# Patient Record
Sex: Female | Born: 1950 | Race: Black or African American | Hispanic: No | Smoking: Never smoker
Health system: Southern US, Community
[De-identification: ages and names within clinical notes are randomized; demographics above are authoritative.]

## PROBLEM LIST (undated history)

## (undated) DIAGNOSIS — M1990 Unspecified osteoarthritis, unspecified site: Secondary | ICD-10-CM

## (undated) DIAGNOSIS — E785 Hyperlipidemia, unspecified: Secondary | ICD-10-CM

## (undated) DIAGNOSIS — I10 Essential (primary) hypertension: Secondary | ICD-10-CM

## (undated) DIAGNOSIS — Z87898 Personal history of other specified conditions: Secondary | ICD-10-CM

## (undated) DIAGNOSIS — M25519 Pain in unspecified shoulder: Secondary | ICD-10-CM

## (undated) DIAGNOSIS — I739 Peripheral vascular disease, unspecified: Secondary | ICD-10-CM

## (undated) DIAGNOSIS — Z96642 Presence of left artificial hip joint: Secondary | ICD-10-CM

## (undated) HISTORY — DX: Essential (primary) hypertension: I10

## (undated) HISTORY — DX: Peripheral vascular disease, unspecified: I73.9

## (undated) HISTORY — DX: Pain in unspecified shoulder: M25.519

## (undated) HISTORY — DX: Personal history of other specified conditions: Z87.898

## (undated) HISTORY — DX: Hyperlipidemia, unspecified: E78.5

## (undated) HISTORY — DX: Presence of left artificial hip joint: Z96.642

---

## 2016-03-23 DIAGNOSIS — I10 Essential (primary) hypertension: Secondary | ICD-10-CM

## 2016-03-23 HISTORY — DX: Essential (primary) hypertension: I10

## 2016-05-03 ENCOUNTER — Other Ambulatory Visit: Payer: Self-pay | Admitting: Family Medicine

## 2016-05-03 DIAGNOSIS — Z1231 Encounter for screening mammogram for malignant neoplasm of breast: Secondary | ICD-10-CM

## 2016-06-07 ENCOUNTER — Ambulatory Visit: Payer: Self-pay

## 2017-03-09 ENCOUNTER — Ambulatory Visit (INDEPENDENT_AMBULATORY_CARE_PROVIDER_SITE_OTHER): Payer: Non-veteran care | Admitting: Orthopaedic Surgery

## 2017-03-09 ENCOUNTER — Ambulatory Visit (INDEPENDENT_AMBULATORY_CARE_PROVIDER_SITE_OTHER): Payer: Non-veteran care

## 2017-03-09 ENCOUNTER — Encounter (INDEPENDENT_AMBULATORY_CARE_PROVIDER_SITE_OTHER): Payer: Self-pay | Admitting: Orthopaedic Surgery

## 2017-03-09 DIAGNOSIS — M1612 Unilateral primary osteoarthritis, left hip: Secondary | ICD-10-CM

## 2017-03-09 NOTE — Progress Notes (Signed)
Office Visit Note   Patient: Terri Rasmussen           Date of Birth: 04/22/1950           MRN: [id number] Visit Date: 03/09/2017              Requested by: Prentiss BellsLizardo-Morales, Diana, MD [address] [place], [place] [address] PCP: Asencion GowdaMitchell, L.August Saucerean, MD   Assessment & Plan: Visit Diagnoses:  1. Primary osteoarthritis of left hip     Plan: Impression is degenerative joint disease of the left hip.  X-rays demonstrate destruction of joint space with multiple subchondral cysts and osteophyte formation.  These were discussed with the patient as well as treatment options including nonoperative treatment.  She wishes to proceed with a total hip replacement after a full discussion of risk benefits alternatives.  She will let us know when she would like to schedule this.  Follow-Up Instructions: Return if symptoms worsen or fail to improve.   Orders:  Orders Placed This Encounter  Procedures  . XR HIP UNILAT W OR W/O PELVIS 2-3 VIEWS LEFT   No orders of the defined types were placed in this encounter.     Procedures: No procedures performed   Clinical Data: No additional findings.   Subjective: Chief Complaint  Patient presents with  . Left Hip - Pain    Ms. Terri Rasmussen is a very pleasant 66 year old female who comes in with left hip pain that radiates down the anterior thigh and into the groin.  She takes ibuprofen which gives partial relief.  She has significant difficulty with ADLs and chronic pain with ambulation.  This is causing her to limp.  She denies any numbness and tingling    Review of Systems  Constitutional: Negative.   HENT: Negative.   Eyes: Negative.   Respiratory: Negative.   Cardiovascular: Negative.   Endocrine: Negative.   Musculoskeletal: Negative.   Neurological: Negative.   Hematological: Negative.   Psychiatric/Behavioral: Negative.   All other systems reviewed and are negative.    Objective: Vital Signs: There were no vitals taken for this  visit.  Physical Exam  Constitutional: She is oriented to person, place, and time. She appears well-developed and well-nourished.  HENT:  Head: Normocephalic and atraumatic.  Eyes: EOM are normal.  Neck: Neck supple.  Pulmonary/Chest: Effort normal.  Abdominal: Soft.  Neurological: She is alert and oriented to person, place, and time.  Skin: Skin is warm. Capillary refill takes less than 2 seconds.  Psychiatric: She has a normal mood and affect. Her behavior is normal. Judgment and thought content normal.  Nursing note and vitals reviewed.   Ortho Exam Left hip exam shows painful internal and external rotation.  Positive Stinchfield sign.  Lateral hip is nontender.  Negative straight leg impression is advanced degenerative joint disease left hip Specialty Comments:  No specialty comments available.  Imaging: Xr Hip Unilat W Or W/o Pelvis 2-3 Views Left  Result Date: 03/09/2017 Advanced left hip DJD    PMFS History: There are no active problems to display for this patient.  History reviewed. No pertinent past medical history.  History reviewed. No pertinent family history.  History reviewed. No pertinent surgical history. Social History   Occupational History  . Not on file  Tobacco Use  . Smoking status: Never Smoker  . Smokeless tobacco: Never Used  Substance and Sexual Activity  . Alcohol use: Not on file  . Drug use: Not on file  . Sexual activity: Not on file

## 2017-03-16 ENCOUNTER — Ambulatory Visit (INDEPENDENT_AMBULATORY_CARE_PROVIDER_SITE_OTHER): Payer: Non-veteran care | Admitting: Orthopaedic Surgery

## 2017-03-16 ENCOUNTER — Encounter (INDEPENDENT_AMBULATORY_CARE_PROVIDER_SITE_OTHER): Payer: Self-pay | Admitting: Orthopaedic Surgery

## 2017-03-16 DIAGNOSIS — M1612 Unilateral primary osteoarthritis, left hip: Secondary | ICD-10-CM | POA: Diagnosis not present

## 2017-03-16 NOTE — Progress Notes (Signed)
   Office Visit Note   Patient: Terri Rasmussen           Date of Birth: 09/26/1950           MRN: [id number] Visit Date: 03/16/2017              Requested by: Clovis RileyMitchell, L.August Saucerean, MD [address] [place], [place] [address] PCP: Clovis RileyMitchell, L.August Saucerean, MD   Assessment & Plan: Visit Diagnoses:  1. Primary osteoarthritis of left hip     Plan: Impression is advanced degenerative joint disease left hip.  Patient has no life and has significant difficulty with ADLs and constant night pain.  She wishes to pursue left total hip replacement.  We discussed the risk benefits alternatives to surgery and she understands and wishes to proceed.  We will schedule this in the near future per her convenience.  Follow-Up Instructions: Return if symptoms worsen or fail to improve.   Orders:  No orders of the defined types were placed in this encounter.  No orders of the defined types were placed in this encounter.     Procedures: No procedures performed   Clinical Data: No additional findings.   Subjective: Chief Complaint  Patient presents with  . Left Hip - Pain, Follow-up    Patient comes in today to discuss total hip replacement surgery and to have her questions answered.  She is ready to have the surgery.  She presents here today with her son.    Review of Systems   Objective: Vital Signs: There were no vitals taken for this visit.  Physical Exam  Ortho Exam Left hip exam is stable. Specialty Comments:  No specialty comments available.  Imaging: No results found.   PMFS History: There are no active problems to display for this patient.  No past medical history on file.  No family history on file.  No past surgical history on file. Social History   Occupational History  . Not on file  Tobacco Use  . Smoking status: Never Smoker  . Smokeless tobacco: Never Used  Substance and Sexual Activity  . Alcohol use: Not on file  . Drug use: Not on file  .  Sexual activity: Not on file

## 2017-04-25 NOTE — Pre-Procedure Instructions (Signed)
Terri Rasmussen  04/25/2017     No Pharmacies Listed   Your procedure is scheduled on  Wednesday 05/03/17  [facility] at 530 A.M.  Call this number if you have problems the morning of surgery:  [phone]   Remember:  Do not eat food or drink liquids after midnight.  Take these medicines the morning of surgery with A SIP OF WATER -  NONE  7 days prior to surgery STOP taking any Aspirin(unless otherwise instructed by your surgeon), Aleve, Naproxen, Ibuprofen, Motrin, Advil, Goody's, BC's, all herbal medications, fish oil, and all vitamins   Do not wear jewelry, make-up or nail polish.  Do not wear lotions, powders, or perfumes, or deodorant.  Do not shave 48 hours prior to surgery.  Men may shave face and neck.  Do not bring valuables to the hospital.  [facility] is not responsible for any belongings or valuables.  Contacts, dentures or bridgework may not be worn into surgery.  Leave your suitcase in the car.  After surgery it may be brought to your room.  For patients admitted to the hospital, discharge time will be determined by your treatment team.  Patients discharged the day of surgery will not be allowed to drive home.   Name and phone number of your driver:    Special instructions:  [facility] - Preparing for Surgery  Before surgery, you can play an important role.  Because skin is not sterile, your skin needs to be as free of germs as possible.  You can reduce the number of germs on you skin by washing with CHG (chlorahexidine gluconate) soap before surgery.  CHG is an antiseptic cleaner which kills germs and bonds with the skin to continue killing germs even after washing.  Please DO NOT use if you have an allergy to CHG or antibacterial soaps.  If your skin becomes reddened/irritated stop using the CHG and inform your nurse when you arrive at Short Stay.  Do not shave (including legs and underarms) for at least 48 hours prior to the  first CHG shower.  You may shave your face.  Please follow these instructions carefully:   1.  Shower with CHG Soap the night before surgery and the                                morning of Surgery.  2.  If you choose to wash your hair, wash your hair first as usual with your       normal shampoo.  3.  After you shampoo, rinse your hair and body thoroughly to remove the                      Shampoo.  4.  Use CHG as you would any other liquid soap.  You can apply chg directly       to the skin and wash gently with scrungie or a clean washcloth.  5.  Apply the CHG Soap to your body ONLY FROM THE NECK DOWN.        Do not use on open wounds or open sores.  Avoid contact with your eyes,       ears, mouth and genitals (private parts).  Wash genitals (private parts)       with your normal soap.  6.  Wash thoroughly, paying special attention to the area where your surgery  will be performed.  7.  Thoroughly rinse your body with warm water from the neck down.  8.  DO NOT shower/wash with your normal soap after using and rinsing off       the CHG Soap.  9.  Pat yourself dry with a clean towel.            10.  Wear clean pajamas.            11.  Place clean sheets on your bed the night of your first shower and do not        sleep with pets.  Day of Surgery  Do not apply any lotions/deoderants the morning of surgery.  Please wear clean clothes to the hospital/surgery center.    Please read over the following fact sheets that you were given. Total Joint Packet and MRSA Information

## 2017-04-26 ENCOUNTER — Encounter (HOSPITAL_COMMUNITY)
Admission: RE | Admit: 2017-04-26 | Discharge: 2017-04-26 | Disposition: A | Discharge status: Home / Self Care | Payer: Non-veteran care | Source: Ambulatory Visit | Attending: Orthopaedic Surgery | Admitting: Orthopaedic Surgery

## 2017-04-26 ENCOUNTER — Encounter (HOSPITAL_COMMUNITY)
Admission: RE | Admit: 2017-04-26 | Discharge: 2017-04-26 | Disposition: A | Discharge status: Home / Self Care | Payer: Non-veteran care | Source: Ambulatory Visit | Attending: Physician Assistant | Admitting: Physician Assistant

## 2017-04-26 ENCOUNTER — Encounter (HOSPITAL_COMMUNITY): Payer: Self-pay | Admitting: *Deleted

## 2017-04-26 ENCOUNTER — Other Ambulatory Visit: Payer: Self-pay

## 2017-04-26 DIAGNOSIS — M1612 Unilateral primary osteoarthritis, left hip: Secondary | ICD-10-CM | POA: Diagnosis not present

## 2017-04-26 DIAGNOSIS — Z01818 Encounter for other preprocedural examination: Secondary | ICD-10-CM | POA: Diagnosis present

## 2017-04-26 HISTORY — DX: Unspecified osteoarthritis, unspecified site: M19.90

## 2017-04-26 LAB — CBC WITH DIFFERENTIAL/PLATELET
BASOS ABS: 0 10*3/uL (ref 0.0–0.1)
BASOS PCT: 0 %
Eosinophils Absolute: 0.1 10*3/uL (ref 0.0–0.7)
Eosinophils Relative: 3 %
HEMATOCRIT: 39.5 % (ref 36.0–46.0)
Hemoglobin: 13 g/dL (ref 12.0–15.0)
LYMPHS PCT: 35 %
Lymphs Abs: 1.8 10*3/uL (ref 0.7–4.0)
MCH: 31.4 pg (ref 26.0–34.0)
MCHC: 32.9 g/dL (ref 30.0–36.0)
MCV: 95.4 fL (ref 78.0–100.0)
Monocytes Absolute: 0.4 10*3/uL (ref 0.1–1.0)
Monocytes Relative: 8 %
NEUTROS ABS: 2.8 10*3/uL (ref 1.7–7.7)
Neutrophils Relative %: 54 %
Platelets: 209 10*3/uL (ref 150–400)
RBC: 4.14 MIL/uL (ref 3.87–5.11)
RDW: 12.6 % (ref 11.5–15.5)
WBC: 5.1 10*3/uL (ref 4.0–10.5)

## 2017-04-26 LAB — COMPREHENSIVE METABOLIC PANEL
ALBUMIN: 4.1 g/dL (ref 3.5–5.0)
ALT: 27 U/L (ref 14–54)
AST: 25 U/L (ref 15–41)
Alkaline Phosphatase: 67 U/L (ref 38–126)
Anion gap: 10 (ref 5–15)
BILIRUBIN TOTAL: 0.8 mg/dL (ref 0.3–1.2)
BUN: 8 mg/dL (ref 6–20)
CHLORIDE: 107 mmol/L (ref 101–111)
CO2: 25 mmol/L (ref 22–32)
CREATININE: 0.94 mg/dL (ref 0.44–1.00)
Calcium: 9.1 mg/dL (ref 8.9–10.3)
GFR calc Af Amer: >60 mL/min (ref >60)
GLUCOSE: 99 mg/dL (ref 65–99)
Potassium: 3.5 mmol/L (ref 3.5–5.1)
Sodium: 142 mmol/L (ref 135–145)
TOTAL PROTEIN: 7.6 g/dL (ref 6.5–8.1)

## 2017-04-26 LAB — SURGICAL PCR SCREEN
MRSA, PCR: POSITIVE — AB
Staphylococcus aureus: POSITIVE — AB

## 2017-04-26 LAB — APTT: APTT: 29 s (ref 24–36)

## 2017-04-26 LAB — TYPE AND SCREEN
ABO/RH(D): B POS
ANTIBODY SCREEN: NEGATIVE

## 2017-04-26 LAB — ABO/RH: ABO/RH(D): B POS

## 2017-04-26 LAB — PROTIME-INR
INR: 0.98
PROTHROMBIN TIME: 12.9 s (ref 11.4–15.2)

## 2017-04-26 NOTE — Progress Notes (Signed)
Could you call this patient and let them know nasal swab positive for mrsa and that we will call in mupirocin nasal ointment?  Will you also send in the mupirocin ointment?  Apply to both nostrils tid for 5 days prior to sx

## 2017-04-26 NOTE — Progress Notes (Signed)
Let's add 1 g of vanc in addition to ancef please.  Thanks.

## 2017-04-27 ENCOUNTER — Other Ambulatory Visit (INDEPENDENT_AMBULATORY_CARE_PROVIDER_SITE_OTHER): Payer: Self-pay

## 2017-04-27 ENCOUNTER — Other Ambulatory Visit (INDEPENDENT_AMBULATORY_CARE_PROVIDER_SITE_OTHER): Payer: Self-pay | Admitting: Physician Assistant

## 2017-04-27 NOTE — Progress Notes (Signed)
IC patient and she already has the ointment, they called it in for her from hospital and she got it yesterday

## 2017-04-27 NOTE — Progress Notes (Signed)
done

## 2017-05-02 MED ORDER — TRANEXAMIC ACID 1000 MG/10ML IV SOLN
1000.0000 mg | INTRAVENOUS | Status: AC
  Administered 2017-05-03: 1000 mg via INTRAVENOUS
  Filled 2017-05-02: qty 1100

## 2017-05-02 MED ORDER — VANCOMYCIN HCL IN DEXTROSE 1-5 GM/200ML-% IV SOLN
1000.0000 mg | INTRAVENOUS | Status: DC
  Filled 2017-05-02: qty 200

## 2017-05-02 NOTE — Anesthesia Preprocedure Evaluation (Addendum)
Anesthesia Evaluation  Patient identified by MRN, date of birth, ID band Patient awake    Reviewed: Allergy & Precautions, NPO status , Patient's Chart, lab work & pertinent test results  Airway Mallampati: II  TM Distance: >3 FB Neck ROM: Full    Dental  (+) Dental Advisory Given, Teeth Intact   Pulmonary neg pulmonary ROS,    Pulmonary exam normal breath sounds clear to auscultation       Cardiovascular negative cardio ROS Normal cardiovascular exam Rhythm:Regular Rate:Normal     Neuro/Psych negative neurological ROS  negative psych ROS   GI/Hepatic negative GI ROS, (+)     substance abuse  marijuana use,   Endo/Other  negative endocrine ROS  Renal/GU negative Renal ROS  negative genitourinary   Musculoskeletal  (+) Arthritis ,   Abdominal   Peds  Hematology negative hematology ROS (+)   Anesthesia Other Findings   Reproductive/Obstetrics                            Anesthesia Physical Anesthesia Plan  ASA: II  Anesthesia Plan: Spinal   Post-op Pain Management:    Induction:   PONV Risk Score and Plan: Treatment may vary due to age or medical condition  Airway Management Planned: Natural Airway and Nasal Cannula  Additional Equipment: None  Intra-op Plan:   Post-operative Plan:   Informed Consent: I have reviewed the patients History and Physical, chart, labs and discussed the procedure including the risks, benefits and alternatives for the proposed anesthesia with the patient or authorized representative who has indicated his/her understanding and acceptance.   Dental advisory given  Plan Discussed with: CRNA  Anesthesia Plan Comments:         Anesthesia Quick Evaluation

## 2017-05-03 ENCOUNTER — Encounter (HOSPITAL_COMMUNITY)
Admission: RE | Disposition: A | Discharge status: Home Health | Payer: Self-pay | Source: Ambulatory Visit | Attending: Orthopaedic Surgery

## 2017-05-03 ENCOUNTER — Inpatient Hospital Stay (HOSPITAL_COMMUNITY): Payer: Non-veteran care | Admitting: Anesthesiology

## 2017-05-03 ENCOUNTER — Inpatient Hospital Stay (HOSPITAL_COMMUNITY): Payer: Non-veteran care

## 2017-05-03 ENCOUNTER — Other Ambulatory Visit: Payer: Self-pay

## 2017-05-03 ENCOUNTER — Inpatient Hospital Stay (HOSPITAL_COMMUNITY)
Admission: RE | Admit: 2017-05-03 | Discharge: 2017-05-04 | DRG: 470 | Disposition: A | Discharge status: Home Health | Payer: Non-veteran care | Source: Ambulatory Visit | Attending: Orthopaedic Surgery | Admitting: Orthopaedic Surgery

## 2017-05-03 ENCOUNTER — Encounter (HOSPITAL_COMMUNITY): Payer: Self-pay

## 2017-05-03 DIAGNOSIS — M1612 Unilateral primary osteoarthritis, left hip: Principal | ICD-10-CM | POA: Diagnosis present

## 2017-05-03 DIAGNOSIS — Z96642 Presence of left artificial hip joint: Secondary | ICD-10-CM

## 2017-05-03 DIAGNOSIS — Z96649 Presence of unspecified artificial hip joint: Secondary | ICD-10-CM

## 2017-05-03 DIAGNOSIS — Z419 Encounter for procedure for purposes other than remedying health state, unspecified: Secondary | ICD-10-CM

## 2017-05-03 DIAGNOSIS — Z888 Allergy status to other drugs, medicaments and biological substances status: Secondary | ICD-10-CM | POA: Diagnosis not present

## 2017-05-03 HISTORY — PX: TOTAL HIP ARTHROPLASTY: SHX124

## 2017-05-03 HISTORY — DX: Presence of left artificial hip joint: Z96.642

## 2017-05-03 SURGERY — ARTHROPLASTY, HIP, TOTAL, ANTERIOR APPROACH
Anesthesia: Spinal | Site: Hip | Laterality: Left

## 2017-05-03 MED ORDER — PROPOFOL 10 MG/ML IV BOLUS
INTRAVENOUS | Status: DC | PRN
  Administered 2017-05-03: 30 mg via INTRAVENOUS

## 2017-05-03 MED ORDER — DIPHENHYDRAMINE HCL 12.5 MG/5ML PO ELIX: 25.0000 mg | ORAL_SOLUTION | ORAL | Status: DC | PRN

## 2017-05-03 MED ORDER — SODIUM CHLORIDE 0.9 % IV SOLN
INTRAVENOUS | Status: DC
  Administered 2017-05-03: 15:00:00 via INTRAVENOUS

## 2017-05-03 MED ORDER — SORBITOL 70 % SOLN: 30.0000 mL | Freq: Every day | Status: DC | PRN

## 2017-05-03 MED ORDER — PROMETHAZINE HCL 25 MG PO TABS: 25.0000 mg | ORAL_TABLET | Freq: Four times a day (QID) | ORAL | 1 refills | Status: DC | PRN

## 2017-05-03 MED ORDER — ALUM & MAG HYDROXIDE-SIMETH 200-200-20 MG/5ML PO SUSP: 30.0000 mL | ORAL | Status: DC | PRN

## 2017-05-03 MED ORDER — MAGNESIUM CITRATE PO SOLN: 1.0000 | Freq: Once | ORAL | Status: DC | PRN

## 2017-05-03 MED ORDER — PROPOFOL 500 MG/50ML IV EMUL
INTRAVENOUS | Status: DC | PRN
  Administered 2017-05-03: 100 ug/kg/min via INTRAVENOUS

## 2017-05-03 MED ORDER — ASPIRIN EC 325 MG PO TBEC
325.0000 mg | DELAYED_RELEASE_TABLET | Freq: Two times a day (BID) | ORAL | Status: DC
  Administered 2017-05-03 – 2017-05-04 (×2): 325 mg via ORAL
  Filled 2017-05-03 (×2): qty 1

## 2017-05-03 MED ORDER — ACETAMINOPHEN 500 MG PO TABS
1000.0000 mg | ORAL_TABLET | Freq: Four times a day (QID) | ORAL | Status: AC
  Administered 2017-05-03 – 2017-05-04 (×3): 1000 mg via ORAL
  Filled 2017-05-03 (×3): qty 2

## 2017-05-03 MED ORDER — ACETAMINOPHEN 325 MG PO TABS: 650.0000 mg | ORAL_TABLET | ORAL | Status: DC | PRN

## 2017-05-03 MED ORDER — CHLORHEXIDINE GLUCONATE 4 % EX LIQD: 60.0000 mL | Freq: Once | CUTANEOUS | Status: DC

## 2017-05-03 MED ORDER — CEFAZOLIN SODIUM-DEXTROSE 2-4 GM/100ML-% IV SOLN
2.0000 g | Freq: Four times a day (QID) | INTRAVENOUS | Status: AC
  Administered 2017-05-03 – 2017-05-04 (×3): 2 g via INTRAVENOUS
  Filled 2017-05-03 (×3): qty 100

## 2017-05-03 MED ORDER — DEXAMETHASONE SODIUM PHOSPHATE 10 MG/ML IJ SOLN
10.0000 mg | Freq: Once | INTRAMUSCULAR | Status: AC
  Administered 2017-05-04: 10 mg via INTRAVENOUS
  Filled 2017-05-03: qty 1

## 2017-05-03 MED ORDER — PROMETHAZINE HCL 25 MG/ML IJ SOLN: 6.2500 mg | INTRAMUSCULAR | Status: DC | PRN

## 2017-05-03 MED ORDER — BUPIVACAINE IN DEXTROSE 0.75-8.25 % IT SOLN
INTRATHECAL | Status: DC | PRN
  Administered 2017-05-03: 1.8 mL via INTRATHECAL

## 2017-05-03 MED ORDER — MORPHINE SULFATE (PF) 2 MG/ML IV SOLN: 1.0000 mg | INTRAVENOUS | Status: DC | PRN

## 2017-05-03 MED ORDER — OXYCODONE HCL 5 MG PO TABS: 5.0000 mg | ORAL_TABLET | Freq: Once | ORAL | Status: DC | PRN

## 2017-05-03 MED ORDER — VANCOMYCIN HCL IN DEXTROSE 1-5 GM/200ML-% IV SOLN
1000.0000 mg | INTRAVENOUS | Status: AC
  Administered 2017-05-03: 1000 mg via INTRAVENOUS
  Filled 2017-05-03: qty 200

## 2017-05-03 MED ORDER — LIDOCAINE HCL (CARDIAC) 20 MG/ML IV SOLN
INTRAVENOUS | Status: DC | PRN
  Administered 2017-05-03: 100 mg via INTRAVENOUS

## 2017-05-03 MED ORDER — MIDAZOLAM HCL 2 MG/2ML IJ SOLN
INTRAMUSCULAR | Status: DC | PRN
  Administered 2017-05-03: 2 mg via INTRAVENOUS

## 2017-05-03 MED ORDER — FENTANYL CITRATE (PF) 100 MCG/2ML IJ SOLN
INTRAMUSCULAR | Status: DC | PRN
  Administered 2017-05-03: 50 ug via INTRAVENOUS

## 2017-05-03 MED ORDER — VANCOMYCIN HCL 1000 MG IV SOLR
INTRAVENOUS | Status: AC
  Filled 2017-05-03: qty 1000

## 2017-05-03 MED ORDER — OXYCODONE HCL ER 10 MG PO T12A: 10.0000 mg | EXTENDED_RELEASE_TABLET | Freq: Two times a day (BID) | ORAL | 0 refills | Status: DC

## 2017-05-03 MED ORDER — MIDAZOLAM HCL 2 MG/2ML IJ SOLN
INTRAMUSCULAR | Status: AC
  Filled 2017-05-03: qty 2

## 2017-05-03 MED ORDER — VANCOMYCIN HCL 1000 MG IV SOLR
INTRAVENOUS | Status: DC | PRN
  Administered 2017-05-03: 1000 mg via TOPICAL

## 2017-05-03 MED ORDER — METOCLOPRAMIDE HCL 5 MG PO TABS: 5.0000 mg | ORAL_TABLET | Freq: Three times a day (TID) | ORAL | Status: DC | PRN

## 2017-05-03 MED ORDER — 0.9 % SODIUM CHLORIDE (POUR BTL) OPTIME
TOPICAL | Status: DC | PRN
  Administered 2017-05-03: 1000 mL

## 2017-05-03 MED ORDER — OXYCODONE HCL 5 MG PO TABS: 10.0000 mg | ORAL_TABLET | ORAL | Status: DC | PRN

## 2017-05-03 MED ORDER — TRANEXAMIC ACID 1000 MG/10ML IV SOLN
1000.0000 mg | Freq: Once | INTRAVENOUS | Status: AC
  Administered 2017-05-03: 1000 mg via INTRAVENOUS
  Filled 2017-05-03: qty 10

## 2017-05-03 MED ORDER — ONDANSETRON HCL 4 MG/2ML IJ SOLN: 4.0000 mg | Freq: Four times a day (QID) | INTRAMUSCULAR | Status: DC | PRN

## 2017-05-03 MED ORDER — MENTHOL 3 MG MT LOZG: 1.0000 | LOZENGE | OROMUCOSAL | Status: DC | PRN

## 2017-05-03 MED ORDER — TIZANIDINE HCL 4 MG PO TABS: 4.0000 mg | ORAL_TABLET | Freq: Four times a day (QID) | ORAL | 2 refills | Status: DC | PRN

## 2017-05-03 MED ORDER — TRANEXAMIC ACID 1000 MG/10ML IV SOLN
2000.0000 mg | Freq: Once | INTRAVENOUS | Status: DC
  Filled 2017-05-03 (×2): qty 20

## 2017-05-03 MED ORDER — FENTANYL CITRATE (PF) 100 MCG/2ML IJ SOLN: 25.0000 ug | INTRAMUSCULAR | Status: DC | PRN

## 2017-05-03 MED ORDER — METOCLOPRAMIDE HCL 5 MG/ML IJ SOLN: 5.0000 mg | Freq: Three times a day (TID) | INTRAMUSCULAR | Status: DC | PRN

## 2017-05-03 MED ORDER — PROPOFOL 10 MG/ML IV BOLUS
INTRAVENOUS | Status: AC
  Filled 2017-05-03: qty 20

## 2017-05-03 MED ORDER — OXYCODONE HCL 5 MG PO TABS: 5.0000 mg | ORAL_TABLET | ORAL | 0 refills | Status: DC | PRN

## 2017-05-03 MED ORDER — OXYCODONE HCL ER 10 MG PO T12A
10.0000 mg | EXTENDED_RELEASE_TABLET | Freq: Two times a day (BID) | ORAL | Status: DC
  Administered 2017-05-03 – 2017-05-04 (×3): 10 mg via ORAL
  Filled 2017-05-03 (×3): qty 1

## 2017-05-03 MED ORDER — PHENYLEPHRINE HCL 10 MG/ML IJ SOLN
INTRAMUSCULAR | Status: DC | PRN
  Administered 2017-05-03: 80 ug via INTRAVENOUS

## 2017-05-03 MED ORDER — SENNOSIDES-DOCUSATE SODIUM 8.6-50 MG PO TABS: 1.0000 | ORAL_TABLET | Freq: Every evening | ORAL | 1 refills | Status: DC | PRN

## 2017-05-03 MED ORDER — LACTATED RINGERS IV SOLN
INTRAVENOUS | Status: DC
  Administered 2017-05-03 (×2): via INTRAVENOUS

## 2017-05-03 MED ORDER — ONDANSETRON HCL 4 MG PO TABS: 4.0000 mg | ORAL_TABLET | Freq: Three times a day (TID) | ORAL | 0 refills | Status: DC | PRN

## 2017-05-03 MED ORDER — POLYETHYLENE GLYCOL 3350 17 G PO PACK: 17.0000 g | PACK | Freq: Every day | ORAL | Status: DC | PRN

## 2017-05-03 MED ORDER — FENTANYL CITRATE (PF) 250 MCG/5ML IJ SOLN
INTRAMUSCULAR | Status: AC
  Filled 2017-05-03: qty 5

## 2017-05-03 MED ORDER — CEFAZOLIN SODIUM-DEXTROSE 2-4 GM/100ML-% IV SOLN
2.0000 g | INTRAVENOUS | Status: AC
  Administered 2017-05-03: 2 g via INTRAVENOUS
  Filled 2017-05-03: qty 100

## 2017-05-03 MED ORDER — OXYCODONE HCL 5 MG PO TABS: 5.0000 mg | ORAL_TABLET | ORAL | Status: DC | PRN

## 2017-05-03 MED ORDER — PHENOL 1.4 % MT LIQD: 1.0000 | OROMUCOSAL | Status: DC | PRN

## 2017-05-03 MED ORDER — OXYCODONE HCL 5 MG/5ML PO SOLN: 5.0000 mg | Freq: Once | ORAL | Status: DC | PRN

## 2017-05-03 MED ORDER — TRANEXAMIC ACID 1000 MG/10ML IV SOLN
INTRAVENOUS | Status: AC | PRN
  Administered 2017-05-03: 2000 mg via TOPICAL

## 2017-05-03 MED ORDER — METHOCARBAMOL 1000 MG/10ML IJ SOLN
500.0000 mg | Freq: Four times a day (QID) | INTRAVENOUS | Status: DC | PRN
  Filled 2017-05-03: qty 5

## 2017-05-03 MED ORDER — SODIUM CHLORIDE 0.9 % IR SOLN
Status: DC | PRN
  Administered 2017-05-03: 3000 mL

## 2017-05-03 MED ORDER — KETOROLAC TROMETHAMINE 15 MG/ML IJ SOLN
15.0000 mg | Freq: Four times a day (QID) | INTRAMUSCULAR | Status: AC
  Administered 2017-05-03 – 2017-05-04 (×3): 15 mg via INTRAVENOUS
  Filled 2017-05-03 (×3): qty 1

## 2017-05-03 MED ORDER — ACETAMINOPHEN 650 MG RE SUPP: 650.0000 mg | RECTAL | Status: DC | PRN

## 2017-05-03 MED ORDER — ASPIRIN EC 325 MG PO TBEC: 325.0000 mg | DELAYED_RELEASE_TABLET | Freq: Two times a day (BID) | ORAL | 0 refills | Status: DC

## 2017-05-03 MED ORDER — ONDANSETRON HCL 4 MG PO TABS: 4.0000 mg | ORAL_TABLET | Freq: Four times a day (QID) | ORAL | Status: DC | PRN

## 2017-05-03 MED ORDER — ONDANSETRON HCL 4 MG/2ML IJ SOLN
INTRAMUSCULAR | Status: DC | PRN
  Administered 2017-05-03: 4 mg via INTRAVENOUS

## 2017-05-03 MED ORDER — METHOCARBAMOL 500 MG PO TABS: 500.0000 mg | ORAL_TABLET | Freq: Four times a day (QID) | ORAL | Status: DC | PRN

## 2017-05-03 SURGICAL SUPPLY — 43 items
BAG DECANTER FOR FLEXI CONT (MISCELLANEOUS) ×3 IMPLANT
CAPT HIP TOTAL 2 ×3 IMPLANT
CELLS DAT CNTRL 66122 CELL SVR (MISCELLANEOUS) ×1 IMPLANT
CLOSURE STERI-STRIP 1/2X4 (GAUZE/BANDAGES/DRESSINGS) ×1
CLSR STERI-STRIP ANTIMIC 1/2X4 (GAUZE/BANDAGES/DRESSINGS) ×2 IMPLANT
COVER SURGICAL LIGHT HANDLE (MISCELLANEOUS) ×3 IMPLANT
DRAPE C-ARM 42X72 X-RAY (DRAPES) ×3 IMPLANT
DRAPE STERI IOBAN 125X83 (DRAPES) ×3 IMPLANT
DRAPE U-SHAPE 47X51 STRL (DRAPES) ×6 IMPLANT
DRSG AQUACEL AG ADV 3.5X10 (GAUZE/BANDAGES/DRESSINGS) ×3 IMPLANT
DURAPREP 26ML APPLICATOR (WOUND CARE) ×3 IMPLANT
ELECT BLADE 4.0 EZ CLEAN MEGAD (MISCELLANEOUS) ×3
ELECT REM PT RETURN 9FT ADLT (ELECTROSURGICAL) ×3
ELECTRODE BLDE 4.0 EZ CLN MEGD (MISCELLANEOUS) ×1 IMPLANT
ELECTRODE REM PT RTRN 9FT ADLT (ELECTROSURGICAL) ×1 IMPLANT
GLOVE SKINSENSE NS SZ7.5 (GLOVE) ×2
GLOVE SKINSENSE STRL SZ7.5 (GLOVE) ×1 IMPLANT
GLOVE SURG SYN 7.5  E (GLOVE) ×4
GLOVE SURG SYN 7.5 E (GLOVE) ×2 IMPLANT
GOWN SRG XL XLNG 56XLVL 4 (GOWN DISPOSABLE) ×1 IMPLANT
GOWN STRL NON-REIN XL XLG LVL4 (GOWN DISPOSABLE) ×2
GOWN STRL REUS W/ TWL LRG LVL3 (GOWN DISPOSABLE) IMPLANT
GOWN STRL REUS W/TWL LRG LVL3 (GOWN DISPOSABLE)
HANDPIECE INTERPULSE COAX TIP (DISPOSABLE) ×2
HOOD PEEL AWAY FLYTE STAYCOOL (MISCELLANEOUS) ×6 IMPLANT
IV NS IRRIG 3000ML ARTHROMATIC (IV SOLUTION) ×3 IMPLANT
KIT BASIN OR (CUSTOM PROCEDURE TRAY) ×3 IMPLANT
MARKER SKIN DUAL TIP RULER LAB (MISCELLANEOUS) ×3 IMPLANT
PACK TOTAL JOINT (CUSTOM PROCEDURE TRAY) ×3 IMPLANT
PACK UNIVERSAL I (CUSTOM PROCEDURE TRAY) ×3 IMPLANT
RTRCTR WOUND ALEXIS 18CM MED (MISCELLANEOUS) ×3
SAW OSC TIP CART 19.5X105X1.3 (SAW) ×3 IMPLANT
SET HNDPC FAN SPRY TIP SCT (DISPOSABLE) ×1 IMPLANT
STAPLER VISISTAT 35W (STAPLE) IMPLANT
SUT ETHIBOND 2 V 37 (SUTURE) ×3 IMPLANT
SUT MNCRL AB 3-0 PS2 18 (SUTURE) ×3 IMPLANT
SUT VIC AB 1 CT1 27 (SUTURE) ×2
SUT VIC AB 1 CT1 27XBRD ANBCTR (SUTURE) ×1 IMPLANT
SUT VIC AB 2-0 CT1 27 (SUTURE) ×4
SUT VIC AB 2-0 CT1 TAPERPNT 27 (SUTURE) ×2 IMPLANT
TOWEL OR 17X26 10 PK STRL BLUE (TOWEL DISPOSABLE) ×3 IMPLANT
TRAY CATH 16FR W/PLASTIC CATH (SET/KITS/TRAYS/PACK) IMPLANT
YANKAUER SUCT BULB TIP NO VENT (SUCTIONS) ×6 IMPLANT

## 2017-05-03 NOTE — Discharge Instructions (Signed)

## 2017-05-03 NOTE — Transfer of Care (Signed)
Immediate Anesthesia Transfer of Care Note  Patient: Romilda Proby  Procedure(s) Performed: LEFT TOTAL HIP ARTHROPLASTY ANTERIOR APPROACH (Left Hip)  Patient Location: PACU  Anesthesia Type:MAC and Spinal  Level of Consciousness: awake, alert  and oriented  Airway & Oxygen Therapy: Patient Spontanous Breathing and Patient connected to face mask oxygen  Post-op Assessment: Report given to RN and Post -op Vital signs reviewed and stable  Post vital signs: Reviewed and stable  Last Vitals:  Vitals:   05/03/17 0545  BP: (!) 133/94  Pulse: 63  Resp: 20  Temp: 36.4 C  SpO2: 100%    Last Pain:  Vitals:   05/03/17 0545  TempSrc: Oral  PainSc: 2       Patients Stated Pain Goal: 3 (50/56/97 9480)  Complications: No apparent anesthesia complications

## 2017-05-03 NOTE — Anesthesia Postprocedure Evaluation (Signed)
Anesthesia Post Note  Patient: Terri Rasmussen  Procedure(s) Performed: LEFT TOTAL HIP ARTHROPLASTY ANTERIOR APPROACH (Left Hip)     Patient location during evaluation: PACU Anesthesia Type: Spinal Level of consciousness: awake and alert Pain management: pain level controlled Vital Signs Assessment: post-procedure vital signs reviewed and stable Respiratory status: spontaneous breathing and respiratory function stable Cardiovascular status: blood pressure returned to baseline and stable Postop Assessment: spinal receding and no apparent nausea or vomiting Anesthetic complications: no    Last Vitals:  Vitals:   05/03/17 1055 05/03/17 1110  BP: (!) 141/85   Pulse: (!) 50 (!) 52  Resp: 13 13  Temp:    SpO2: 100% 100%    Last Pain:  Vitals:   05/03/17 0940  TempSrc:   PainSc: 0-No pain    LLE Motor Response: Purposeful movement;Responds to commands (05/03/17 1110) LLE Sensation: Decreased(spinal block) (05/03/17 1110) RLE Motor Response: Purposeful movement;Responds to commands (05/03/17 1110) RLE Sensation: Decreased(spinal block) (05/03/17 1110) L Sensory Level: L5-Outer lower leg, top of foot, great toe (05/03/17 1110) R Sensory Level: L5-Outer lower leg, top of foot, great toe (05/03/17 1110)  Beryle Lathehomas E Rudell Marlowe

## 2017-05-03 NOTE — Progress Notes (Signed)
Orthopedic Tech Progress Note Patient Details:  Terri CarwinVanessa Rasmussen 07/01/1950 [id number] Applied overhead frame to bed.     Post Interventions Patient Tolerated: Well Instructions Provided: Care of device   Jennye MoccasinHughes, Draya Felker Craig 05/03/2017, 7:21 PM

## 2017-05-03 NOTE — Plan of Care (Signed)
  Nutrition: Adequate nutrition will be maintained 05/03/2017 1614 - Progressing by Darrow BussingArcilla, Shawndrea Rutkowski M, RN   Elimination: Will not experience complications related to bowel motility 05/03/2017 1614 - Progressing by Darrow BussingArcilla, Karna Abed M, RN   Pain Managment: General experience of comfort will improve 05/03/2017 1614 - Progressing by Darrow BussingArcilla, Alexandro Line M, RN   Safety: Ability to remain free from injury will improve 05/03/2017 1614 - Progressing by Darrow BussingArcilla, Domonik Levario M, RN

## 2017-05-03 NOTE — Anesthesia Procedure Notes (Signed)
Spinal  Patient location during procedure: OR Start time: 05/03/2017 7:45 AM End time: 05/03/2017 7:48 AM Staffing Anesthesiologist: Beryle LatheBrock, Marjorie Lussier E, MD Performed: anesthesiologist  Preanesthetic Checklist Completed: patient identified, surgical consent, pre-op evaluation, timeout performed, IV checked, risks and benefits discussed and monitors and equipment checked Spinal Block Patient position: sitting Prep: DuraPrep Patient monitoring: heart rate, cardiac monitor, continuous pulse ox and blood pressure Approach: midline Location: L3-4 Injection technique: single-shot Needle Needle type: Pencan  Needle gauge: 24 G Additional Notes Functioning IV was confirmed and monitors were applied. Sterile prep and drape, including hand hygiene, mask, and sterile gloves were used. The patient was positioned and the spine was prepped. The skin was anesthetized with lidocaine. Free flow of clear CSF was obtained prior to injecting local anesthetic into the CSF. The spinal needle aspirated freely following injection. The needle was carefully withdrawn. The patient tolerated the procedure well. Consent was obtained prior to the procedure with all questions answered and concerns addressed. Risks including, but not limited to, bleeding, infection, nerve damage, paralysis, failed block, inadequate analgesia, allergic reaction, high spinal, itching, and headache were discussed and the patient wished to proceed.  Terri Peerhomas [name], MD

## 2017-05-03 NOTE — Op Note (Signed)
LEFT TOTAL HIP ARTHROPLASTY ANTERIOR APPROACH  Procedure Note Terri Rasmussen   [id number]  Pre-op Diagnosis: left hip degenerative joint disease     Post-op Diagnosis: same   Operative Procedures  1. Total hip replacement; Left hip; uncemented [id number]   Personnel  Surgeon(s): Tarry KosXu, Octavius Shin M, MD  Assist: Oneal GroutMary Lindsey Stanbery, PA-C; necessary for the timely completion of procedure and due to complexity of procedure.   Anesthesia: spinal  Prosthesis: Depuy Acetabulum: Pinnacle 52 mm Femur: Corail KA 14 Head: 36 mm size: +1.5 Liner: +4 neutral Bearing Type: ceramic on poly  Total Hip Arthroplasty (Anterior Approach) Op Note:  After informed consent was obtained and the operative extremity marked in the holding area, the patient was brought back to the operating room and placed supine on the HANA table. Next, the operative extremity was prepped and draped in normal sterile fashion. Surgical timeout occurred verifying patient identification, surgical site, surgical procedure and administration of antibiotics.  A modified anterior Smith-Peterson approach to the hip was performed, using the interval between tensor fascia lata and sartorius.  Dissection was carried bluntly down onto the anterior hip capsule. The lateral femoral circumflex vessels were identified and coagulated. A capsulotomy was performed and the capsular flaps tagged for later repair.  Fluoroscopy was utilized to prepare for the femoral neck cut. The neck osteotomy was performed. The femoral head was removed, the acetabular rim was cleared of soft tissue and attention was turned to reaming the acetabulum.  Sequential reaming was performed under fluoroscopic guidance. We reamed to a size 51 mm, and then impacted the acetabular shell. The liner was then placed after irrigation and attention turned to the femur.  After placing the femoral hook, the leg was taken to externally rotated, extended and adducted position taking care  to perform soft tissue releases to allow for adequate mobilization of the femur. Soft tissue was cleared from the shoulder of the greater trochanter and the hook elevator used to improve exposure of the proximal femur. Sequential broaching performed up to a size 14. Trial neck and head were placed. The leg was brought back up to neutral and the construct reduced. The position and sizing of components, offset and leg lengths were checked using fluoroscopy. Stability of the  construct was checked in extension and external rotation without any subluxation or impingement of prosthesis. We dislocated the prosthesis, dropped the leg back into position, removed trial components, and irrigated copiously. The final stem and head was then placed, the leg brought back up, the system reduced and fluoroscopy used to verify positioning.  We irrigated, obtained hemostasis and closed the capsule using #2 ethibond suture.  One gram of vancomycin powder was placed in the surgical bed. The fascia was closed with #1 vicryl plus, the deep fat layer was closed with 0 vicryl, the subcutaneous layers closed with 2.0 Vicryl Plus and the skin closed with 3.0 monocryl and steri strips. A sterile dressing was applied. The patient was awakened in the operating room and taken to recovery in stable condition.  All sponge, needle, and instrument counts were correct at the end of the case.   Position: supine  Complications: none.  Time Out: performed   Drains/Packing: none  Estimated blood loss: 150 cc  Returned to Recovery Room: in good condition.   Antibiotics: yes   Mechanical VTE (DVT) Prophylaxis: sequential compression devices, TED thigh-high  Chemical VTE (DVT) Prophylaxis: aspirin   Fluid Replacement: see anesthesia record  Specimens Removed: 1 to pathology  Sponge and Instrument Count Correct? yes   PACU: portable radiograph - low AP   Admission: inpatient status  Plan/RTC: Return in 2 weeks for staple  removal. Weight Bearing/Load Lower Extremity: full  Hip precautions: none Suture Removal: 10-14 days  Betadine to incision twice daily once dressing is removed on POD#7  N. Glee Arvin, MD [facility] [phone] 9:13 AM     Implant Name Type Inv. Item Serial No. Manufacturer Lot No. LRB No. Used  LINER NEUTRAL [id number] PLUS 4 - [id number] Liner LINER NEUTRAL 52X36MM PLUS 4  DEPUY SYNTHES [id number] Left 1  PIN SECTOR W/GRIP ACE CUP - [id number] Hips PIN SECTOR W/GRIP ACE CUP  DEPUY SYNTHES [id number] Left 1  SCREW 6.5MMX25MM - [id number] Screw SCREW 6.5MMX25MM  DEPUY SYNTHES [id number] Left 1  STEM CORAIL KA14 - [id number] Stem STEM CORAIL KA14  DEPUY SYNTHES [id number] Left 1  HEAD CERAMIC DELTA 36 PLUS 1.5 - [id number] Hips HEAD CERAMIC DELTA 36 PLUS 1.5  DEPUY SYNTHES [id number] Left 1

## 2017-05-03 NOTE — H&P (Signed)
    PREOPERATIVE H&P  Chief Complaint: left hip degenerative joint disease  HPI: Terri Rasmussen is a 67 y.o. female who presents for surgical treatment of left hip degenerative joint disease.  She denies any changes in medical history.  Past Medical History:  Diagnosis Date  . Arthritis    Past Surgical History:  Procedure Laterality Date  . NO PAST SURGERIES     Social History   Socioeconomic History  . Marital status: Legally Separated    Spouse name: None  . Number of children: None  . Years of education: None  . Highest education level: None  Social Needs  . Financial resource strain: None  . Food insecurity - worry: None  . Food insecurity - inability: None  . Transportation needs - medical: None  . Transportation needs - non-medical: None  Occupational History  . None  Tobacco Use  . Smoking status: Never Smoker  . Smokeless tobacco: Never Used  Substance and Sexual Activity  . Alcohol use: Yes    Comment: beer  . Drug use: Yes    Types: Marijuana  . Sexual activity: None  Other Topics Concern  . None  Social History Narrative  . None   History reviewed. No pertinent family history. No Known Allergies Prior to Admission medications   Medication Sig Start Date End Date Taking? Authorizing Provider  ibuprofen (ADVIL,MOTRIN) 600 MG tablet Take 600 mg by mouth every 6 (six) hours as needed for headache or moderate pain. 03/23/16  Yes [provider]     Positive ROS: All other systems have been reviewed and were otherwise negative with the exception of those mentioned in the HPI and as above.  Physical Exam: General: Alert, no acute distress Cardiovascular: No pedal edema Respiratory: No cyanosis, no use of accessory musculature GI: abdomen soft Skin: No lesions in the area of chief complaint Neurologic: Sensation intact distally Psychiatric: Patient is competent for consent with normal mood and affect Lymphatic: no  lymphedema  MUSCULOSKELETAL: exam stable  Assessment: left hip degenerative joint disease  Plan: Plan for Procedure(s): LEFT TOTAL HIP ARTHROPLASTY ANTERIOR APPROACH  The risks benefits and alternatives were discussed with the patient including but not limited to the risks of nonoperative treatment, versus surgical intervention including infection, bleeding, nerve injury,  blood clots, cardiopulmonary complications, morbidity, mortality, among others, and they were willing to proceed.   Glee ArvinMichael Xu, MD   05/03/2017 7:29 AM

## 2017-05-03 NOTE — Evaluation (Signed)
Physical Therapy Evaluation Patient Details Name: Terri CarwinVanessa Fitchett MRN: [id number] DOB: 02/03/1951 Today's Date: 05/03/2017   History of Present Illness  Pt is a 67 y/o female s/p elective L THA, direct anterior approach. Pt with no pertinent PMH.  Clinical Impression  Pt is s/p surgery above with deficits below. Pt tolerated ambulation very well this session and required min guard assist throughout. Reports she will be staying with her daughter at d/c, so she will have assist at home. Will continue to follow acutely to maximize functional mobility independence and safety.     Follow Up Recommendations DC plan and follow up therapy as arranged by surgeon;Supervision for mobility/OOB    Equipment Recommendations  Rolling walker with 5" wheels;3in1 (PT)    Recommendations for Other Services       Precautions / Restrictions Precautions Precautions: None Precaution Comments: Reviewed supine ther ex with pt.  Restrictions Weight Bearing Restrictions: Yes LLE Weight Bearing: Weight bearing as tolerated      Mobility  Bed Mobility Overal bed mobility: Needs Assistance Bed Mobility: Supine to Sit     Supine to sit: Supervision     General bed mobility comments: Supervision for safety. Increased time required to perform secondary to increased pain.   Transfers Overall transfer level: Needs assistance Equipment used: Rolling walker (2 wheeled) Transfers: Sit to/from Stand Sit to Stand: Min guard         General transfer comment: Min guard for safety. Verbal cues for safe hand placement.   Ambulation/Gait Ambulation/Gait assistance: Min guard Ambulation Distance (Feet): 125 Feet Assistive device: Rolling walker (2 wheeled) Gait Pattern/deviations: Step-through pattern;Decreased stride length;Decreased weight shift to left;Antalgic Gait velocity: Decreased  Gait velocity interpretation: Below normal speed for age/gender General Gait Details: Slow, guarded gait. Able to use  step through pattern, however, demonstrated decreased weightshift to the L. Verbal cues for sequencing using RW.   Stairs            Wheelchair Mobility    Modified Rankin (Stroke Patients Only)       Balance Overall balance assessment: Needs assistance Sitting-balance support: No upper extremity supported;Feet supported Sitting balance-Leahy Scale: Good     Standing balance support: Bilateral upper extremity supported;During functional activity Standing balance-Leahy Scale: Poor Standing balance comment: Reliant on BUE support                              Pertinent Vitals/Pain Pain Assessment: 0-10 Pain Score: 3  Pain Location: L hip  Pain Descriptors / Indicators: Aching;Operative site guarding Pain Intervention(s): Limited activity within patient's tolerance;Monitored during session;Repositioned    Home Living Family/patient expects to be discharged to:: Private residence Living Arrangements: Alone(Reports she is going to stay with her daughter) Available Help at Discharge: Family;Available 24 hours/day Type of Home: House Home Access: Level entry     Home Layout: Two level Home Equipment: None      Prior Function Level of Independence: Independent               Hand Dominance        Extremity/Trunk Assessment   Upper Extremity Assessment Upper Extremity Assessment: Overall WFL for tasks assessed    Lower Extremity Assessment Lower Extremity Assessment: LLE deficits/detail LLE Deficits / Details: Sensory in tact. Deficits consistent with post op pain and weakness. Able to perform ther ex below.     Cervical / Trunk Assessment Cervical / Trunk Assessment: Normal  Communication  Communication: No difficulties  Cognition Arousal/Alertness: Awake/alert Behavior During Therapy: WFL for tasks assessed/performed Overall Cognitive Status: Within Functional Limits for tasks assessed                                         General Comments      Exercises Total Joint Exercises Ankle Circles/Pumps: AROM;Both;20 reps Quad Sets: AROM;Left;10 reps Short Arc Quad: AROM;Left;10 reps Heel Slides: AROM;Left;10 reps   Assessment/Plan    PT Assessment Patient needs continued PT services  PT Problem List Decreased strength;Decreased range of motion;Decreased balance;Decreased mobility;Decreased knowledge of use of DME;Pain       PT Treatment Interventions DME instruction;Gait training;Stair training;Functional mobility training;Therapeutic activities;Therapeutic exercise;Balance training;Neuromuscular re-education;Patient/family education    PT Goals (Current goals can be found in the Care Plan section)  Acute Rehab PT Goals Patient Stated Goal: to go home  PT Goal Formulation: With patient Time For Goal Achievement: 05/10/17 Potential to Achieve Goals: Good    Frequency 7X/week   Barriers to discharge        Co-evaluation               AM-PAC PT "6 Clicks" Daily Activity  Outcome Measure Difficulty turning over in bed (including adjusting bedclothes, sheets and blankets)?: A Little Difficulty moving from lying on back to sitting on the side of the bed? : A Little Difficulty sitting down on and standing up from a chair with arms (e.g., wheelchair, bedside commode, etc,.)?: Unable Help needed moving to and from a bed to chair (including a wheelchair)?: A Little Help needed walking in hospital room?: A Little Help needed climbing 3-5 steps with a railing? : A Little 6 Click Score: 16    End of Session Equipment Utilized During Treatment: Gait belt Activity Tolerance: Patient tolerated treatment well Patient left: in chair;with call bell/phone within reach Nurse Communication: Mobility status PT Visit Diagnosis: Other abnormalities of gait and mobility (R26.89);Pain Pain - Right/Left: Left Pain - part of body: Hip    Time: 1610-9604 PT Time Calculation (min) (ACUTE ONLY): 25  min   Charges:   PT Evaluation $PT Eval Low Complexity: 1 Low PT Treatments $Gait Training: 8-22 mins   PT G Codes:        Gladys Damme, PT, DPT  Acute Rehabilitation Services  Pager: [phone]   Lehman Prom 05/03/2017, 5:40 PM

## 2017-05-04 ENCOUNTER — Encounter (HOSPITAL_COMMUNITY): Payer: Self-pay | Admitting: Orthopaedic Surgery

## 2017-05-04 LAB — CBC
HCT: 33.5 % — ABNORMAL LOW (ref 36.0–46.0)
Hemoglobin: 11.1 g/dL — ABNORMAL LOW (ref 12.0–15.0)
MCH: 31.4 pg (ref 26.0–34.0)
MCHC: 33.1 g/dL (ref 30.0–36.0)
MCV: 94.6 fL (ref 78.0–100.0)
Platelets: 151 10*3/uL (ref 150–400)
RBC: 3.54 MIL/uL — AB (ref 3.87–5.11)
RDW: 12.3 % (ref 11.5–15.5)
WBC: 5 10*3/uL (ref 4.0–10.5)

## 2017-05-04 LAB — BASIC METABOLIC PANEL
Anion gap: 9 (ref 5–15)
BUN: 9 mg/dL (ref 6–20)
CO2: 25 mmol/L (ref 22–32)
CREATININE: 0.89 mg/dL (ref 0.44–1.00)
Calcium: 8.3 mg/dL — ABNORMAL LOW (ref 8.9–10.3)
Chloride: 104 mmol/L (ref 101–111)
GFR calc Af Amer: >60 mL/min (ref >60)
GLUCOSE: 147 mg/dL — AB (ref 65–99)
POTASSIUM: 3.3 mmol/L — AB (ref 3.5–5.1)
Sodium: 138 mmol/L (ref 135–145)

## 2017-05-04 NOTE — Progress Notes (Signed)
05/04/17 1636  PT Visit Information  Last PT Received On 05/04/17  Assistance Needed +1  History of Present Illness Pt is a 67 y/o female s/p elective L THA, direct anterior approach. Pt with no pertinent PMH.  Subjective Data  Patient Stated Goal to go home   Precautions  Precautions None  Precaution Comments Reviewed standing HEP with pt.   Restrictions  Weight Bearing Restrictions Yes  LLE Weight Bearing WBAT  Pain Assessment  Pain Assessment 0-10  Pain Score 3  Pain Location L hip   Pain Descriptors / Indicators Aching;Operative site guarding  Pain Intervention(s) Limited activity within patient's tolerance;Monitored during session;Repositioned  Cognition  Arousal/Alertness Awake/alert  Behavior During Therapy WFL for tasks assessed/performed  Overall Cognitive Status Within Functional Limits for tasks assessed  Bed Mobility  General bed mobility comments Sitting in chair upon entry.   Transfers  Overall transfer level Needs assistance  Equipment used Rolling walker (2 wheeled)  Transfers Sit to/from Stand  Sit to Stand Supervision  General transfer comment Supervision for safety. Safe hand placement. Required cues to make sure proper use of RW when backing up to chair as pt wanting to leave it behind.   Ambulation/Gait  Ambulation/Gait assistance Supervision  Ambulation Distance (Feet) 500 Feet  Assistive device Rolling walker (2 wheeled)  Gait Pattern/deviations Step-through pattern;Decreased stride length;Decreased weight shift to left;Antalgic  General Gait Details Slow, guarded gait. Pt with good step through technique. Able to self correct when she took one hand off of RW. Supervision for safety.   Gait velocity Decreased  Gait velocity interpretation Below normal speed for age/gender  Balance  Overall balance assessment Needs assistance  Sitting-balance support No upper extremity supported;Feet supported  Sitting balance-Leahy Scale Good  Standing balance  support Bilateral upper extremity supported;No upper extremity supported;During functional activity  Standing balance-Leahy Scale Fair  Standing balance comment Able to maintain static standing without UE support.   General Comments  General comments (skin integrity, edema, etc.) Attempted to adjust pt's RW she will be taking home, however, noted it was a youth RW. Notified RN and case management and case management called to get pt regular RW.   Exercises  Exercises Total Joint  Total Joint Exercises  Hip ABduction/ADduction AROM;Left;10 reps;Standing  Knee Flexion AROM;Left;10 reps;Standing  Marching in Standing AROM;Left;10 reps;Seated  Standing Hip Extension AROM;Left;10 reps;Standing (cues for upright posture )  PT - End of Session  Equipment Utilized During Treatment Gait belt  Activity Tolerance Patient tolerated treatment well  Patient left in chair;with call bell/phone within reach  Nurse Communication Mobility status  PT - Assessment/Plan  PT Plan Current plan remains appropriate  PT Visit Diagnosis Other abnormalities of gait and mobility (R26.89);Pain  Pain - Right/Left Left  Pain - part of body Hip  PT Frequency (ACUTE ONLY) 7X/week  Follow Up Recommendations DC plan and follow up therapy as arranged by surgeon;Supervision for mobility/OOB  PT equipment Rolling walker with 5" wheels;3in1 (PT)  AM-PAC PT "6 Clicks" Daily Activity Outcome Measure  Difficulty turning over in bed (including adjusting bedclothes, sheets and blankets)? 3  Difficulty moving from lying on back to sitting on the side of the bed?  3  Difficulty sitting down on and standing up from a chair with arms (e.g., wheelchair, bedside commode, etc,.)? 3  Help needed moving to and from a bed to chair (including a wheelchair)? 3  Help needed walking in hospital room? 3  Help needed climbing 3-5 steps with a railing?  3  6 Click Score 18  Mobility G Code  CK  PT Goal Progression  Progress towards PT goals  Progressing toward goals  Acute Rehab PT Goals  PT Goal Formulation With patient  Time For Goal Achievement 05/10/17  Potential to Achieve Goals Good  PT Time Calculation  PT Start Time (ACUTE ONLY) 1426  PT Stop Time (ACUTE ONLY) 1446  PT Time Calculation (min) (ACUTE ONLY) 20 min  PT General Charges  $$ ACUTE PT VISIT 1 Visit  PT Treatments  $Gait Training 8-22 mins   Pt progressing well towards goals. Tolerated increased distance with less assist. Educated and performed standing HEP. Pt reports she feels safe with mobility and is ready to d/c home. Will continue to follow acutely to maximize functional mobility independence and safety.   Terri Rasmussen, PT, DPT  Acute Rehabilitation Services  Pager: [phone]

## 2017-05-04 NOTE — Progress Notes (Signed)
Physical Therapy Treatment Patient Details Name: Terri Rasmussen MRN: [id number] DOB: 10/14/1950 Today's Date: 05/04/2017    History of Present Illness Pt is a 67 y/o female s/p elective L THA, direct anterior approach. Pt with no pertinent PMH.    PT Comments    Pt progressing well towards goals and increased ambulation tolerance. Required min guard to supervision throughout. Reports she will be going to her house instead of her daughters and son and neighbors will be able to assist as needed. Will continue to follow acutely to maximize functional mobility independence and safety.    Follow Up Recommendations  DC plan and follow up therapy as arranged by surgeon;Supervision for mobility/OOB     Equipment Recommendations  Rolling walker with 5" wheels;3in1 (PT)    Recommendations for Other Services       Precautions / Restrictions Precautions Precautions: None Precaution Comments: Reviewed supine/seated ther ex with pt.  Restrictions Weight Bearing Restrictions: Yes LLE Weight Bearing: Weight bearing as tolerated    Mobility  Bed Mobility               General bed mobility comments: Sitting at EOB upon entry.   Transfers Overall transfer level: Needs assistance Equipment used: Rolling walker (2 wheeled) Transfers: Sit to/from Stand Sit to Stand: Supervision         General transfer comment: Supervision for safety. Demonstrated good hand placement.   Ambulation/Gait Ambulation/Gait assistance: Min guard;Supervision Ambulation Distance (Feet): 250 Feet Assistive device: Rolling walker (2 wheeled) Gait Pattern/deviations: Step-through pattern;Decreased stride length;Decreased weight shift to left;Antalgic Gait velocity: Decreased Gait velocity interpretation: Below normal speed for age/gender General Gait Details: Slow, guarded gait. Verbal cues for increased step height on LLE as pt occasionally dragged LLE. Educated about maintaining UEs on RW throughout gait,  as pt taking hand off of RW during ambulation.    Stairs            Wheelchair Mobility    Modified Rankin (Stroke Patients Only)       Balance Overall balance assessment: Needs assistance Sitting-balance support: No upper extremity supported;Feet supported Sitting balance-Leahy Scale: Good     Standing balance support: Bilateral upper extremity supported;During functional activity Standing balance-Leahy Scale: Poor Standing balance comment: Reliant on BUE support                             Cognition Arousal/Alertness: Awake/alert Behavior During Therapy: WFL for tasks assessed/performed Overall Cognitive Status: Within Functional Limits for tasks assessed                                        Exercises Total Joint Exercises Ankle Circles/Pumps: AROM;Both;20 reps Heel Slides: AROM;Left;10 reps Hip ABduction/ADduction: AROM;Left;10 reps Long Arc Quad: AROM;Left;10 reps    General Comments General comments (skin integrity, edema, etc.): Pt reports she is going to her house with no steps and son will be able to assist.       Pertinent Vitals/Pain Pain Assessment: Faces Faces Pain Scale: Hurts a little bit Pain Location: L hip  Pain Descriptors / Indicators: Aching;Operative site guarding Pain Intervention(s): Limited activity within patient's tolerance;Monitored during session;Repositioned    Home Living                      Prior Function  PT Goals (current goals can now be found in the care plan section) Acute Rehab PT Goals Patient Stated Goal: to go home  PT Goal Formulation: With patient Time For Goal Achievement: 05/10/17 Potential to Achieve Goals: Good Progress towards PT goals: Progressing toward goals    Frequency    7X/week      PT Plan Current plan remains appropriate    Co-evaluation              AM-PAC PT "6 Clicks" Daily Activity  Outcome Measure  Difficulty turning over  in bed (including adjusting bedclothes, sheets and blankets)?: A Little Difficulty moving from lying on back to sitting on the side of the bed? : A Little Difficulty sitting down on and standing up from a chair with arms (e.g., wheelchair, bedside commode, etc,.)?: A Little Help needed moving to and from a bed to chair (including a wheelchair)?: A Little Help needed walking in hospital room?: A Little Help needed climbing 3-5 steps with a railing? : A Little 6 Click Score: 18    End of Session Equipment Utilized During Treatment: Gait belt Activity Tolerance: Patient tolerated treatment well Patient left: in chair;with call bell/phone within reach Nurse Communication: Mobility status PT Visit Diagnosis: Other abnormalities of gait and mobility (R26.89);Pain Pain - Right/Left: Left Pain - part of body: Hip     Time: 1610-9604 PT Time Calculation (min) (ACUTE ONLY): 18 min  Charges:  $Gait Training: 8-22 mins                    G Codes:       Terri Rasmussen, PT, DPT  Acute Rehabilitation Services  Pager: [phone]    Lehman Prom 05/04/2017, 1:57 PM

## 2017-05-04 NOTE — Discharge Summary (Signed)
Patient ID: Terri Rasmussen MRN: [id number] DOB/AGE: 67-Jan-1952 67 y.o.  Admit date: 05/03/2017 Discharge date: 05/04/2017  Admission Diagnoses:  Active Problems:   History of hip replacement   Discharge Diagnoses:  Same  Past Medical History:  Diagnosis Date  . Arthritis     Surgeries: Procedure(s): LEFT TOTAL HIP ARTHROPLASTY ANTERIOR APPROACH on 05/03/2017   Consultants:   Discharged Condition: Improved  Hospital Course: Terri Rasmussen is an 67 y.o. female who was admitted 05/03/2017 for operative treatment of primary localized osteoarthritis left hip. Patient has severe unremitting pain that affects sleep, daily activities, and work/hobbies. After pre-op clearance the patient was taken to the operating room on 05/03/2017 and underwent  Procedure(s): LEFT TOTAL HIP ARTHROPLASTY ANTERIOR APPROACH.    Patient was given perioperative antibiotics:  Anti-infectives (From admission, onward)   Start     Dose/Rate Route Frequency Ordered Stop   05/03/17 1400  ceFAZolin (ANCEF) IVPB 2g/100 mL premix     2 g 200 mL/hr over 30 Minutes Intravenous Every 6 hours 05/03/17 1307 05/04/17 0443   05/03/17 0914  vancomycin (VANCOCIN) powder  Status:  Discontinued       As needed 05/03/17 0914 05/03/17 0936   05/03/17 0600  vancomycin (VANCOCIN) IVPB 1000 mg/200 mL premix  Status:  Discontinued     1,000 mg 200 mL/hr over 60 Minutes Intravenous To ShortStay Surgical 05/02/17 1814 05/03/17 0506   05/03/17 0600  vancomycin (VANCOCIN) IVPB 1000 mg/200 mL premix     1,000 mg 200 mL/hr over 60 Minutes Intravenous To ShortStay Surgical 05/03/17 0506 05/03/17 0740   05/03/17 0507  ceFAZolin (ANCEF) IVPB 2g/100 mL premix     2 g 200 mL/hr over 30 Minutes Intravenous On call to O.R. 05/03/17 0507 05/03/17 0815       Patient was given sequential compression devices, early ambulation, and chemoprophylaxis to prevent DVT.  Patient benefited maximally from hospital stay and there were no  complications.    Recent vital signs:  Patient Vitals for the past 24 hrs:  BP Temp Temp src Pulse Resp SpO2  05/04/17 0338 136/82 99 F (37.2 C) Oral 60 18 100 %  05/03/17 2352 124/86 98.9 F (37.2 C) Oral (!) 56 18 100 %  05/03/17 2100 111/82 98.9 F (37.2 C) Oral 60 16 100 %  05/03/17 1309 123/83 99.5 F (37.5 C) Oral 76 - 100 %  05/03/17 1250 127/86 97.7 F (36.5 C) - 67 20 100 %  05/03/17 1240 130/90 - - 65 12 100 %  05/03/17 1210 120/77 - - (!) 57 12 100 %  05/03/17 1140 136/90 - - (!) 56 16 100 %  05/03/17 1110 98/88 - - (!) 52 13 100 %  05/03/17 1055 (!) 141/85 - - (!) 50 13 100 %  05/03/17 1040 (!) 127/93 - - (!) 45 12 100 %  05/03/17 1025 136/82 - - (!) 48 14 100 %  05/03/17 1010 134/78 - - (!) 56 14 100 %  05/03/17 0955 115/79 - - (!) 50 13 100 %  05/03/17 0940 107/75 (!) 97.4 F (36.3 C) - (!) 53 12 100 %     Recent laboratory studies: No results for input(s): WBC, HGB, HCT, PLT, NA, K, CL, CO2, BUN, CREATININE, GLUCOSE, INR, CALCIUM in the last 72 hours.  Invalid input(s): PT, 2   Discharge Medications:   Allergies as of 05/04/2017   No Known Allergies     Medication List    STOP taking these medications  ibuprofen 600 MG tablet Commonly known as:  ADVIL,MOTRIN     TAKE these medications   aspirin EC 325 MG tablet Take 1 tablet (325 mg total) by mouth 2 (two) times daily.   ondansetron 4 MG tablet Commonly known as:  ZOFRAN Take 1-2 tablets (4-8 mg total) by mouth every 8 (eight) hours as needed for nausea or vomiting.   oxyCODONE 10 mg 12 hr tablet Commonly known as:  OXYCONTIN Take 1 tablet (10 mg total) by mouth every 12 (twelve) hours.   oxyCODONE 5 MG immediate release tablet Commonly known as:  Oxy IR/ROXICODONE Take 1-3 tablets (5-15 mg total) by mouth every 4 (four) hours as needed.   promethazine 25 MG tablet Commonly known as:  PHENERGAN Take 1 tablet (25 mg total) by mouth every 6 (six) hours as needed for nausea.    senna-docusate 8.6-50 MG tablet Commonly known as:  SENOKOT S Take 1 tablet by mouth at bedtime as needed.   tiZANidine 4 MG tablet Commonly known as:  ZANAFLEX Take 1 tablet (4 mg total) by mouth every 6 (six) hours as needed for muscle spasms.            Durable Medical Equipment  (From admission, onward)        Start     Ordered   05/03/17 1308  DME Walker rolling  Once    Question:  Patient needs a walker to treat with the following condition  Answer:  History of hip replacement   05/03/17 1307   05/03/17 1308  DME 3 n 1  Once     05/03/17 1307   05/03/17 1308  DME Bedside commode  Once    Question:  Patient needs a bedside commode to treat with the following condition  Answer:  History of hip replacement   05/03/17 1307      Diagnostic Studies: Dg Chest 2 View  Result Date: 04/26/2017 CLINICAL DATA:  Preop for hip replacement surgery. EXAM: CHEST  2 VIEW COMPARISON:  None. FINDINGS: The heart size and mediastinal contours are within normal limits. Both lungs are clear. The visualized skeletal structures are unremarkable. IMPRESSION: No active cardiopulmonary disease. Electronically Signed   By: Lupita Raider, M.D.   On: 04/26/2017 15:12   Dg Pelvis Portable  Result Date: 05/03/2017 CLINICAL DATA:  Postop left hip replacement EXAM: PORTABLE PELVIS 1-2 VIEWS COMPARISON:  03/09/2017 FINDINGS: Changes of left hip replacement. No fracture, subluxation or dislocation. No hardware complicating feature. IMPRESSION: Left hip replacement.  No visible complicating feature. Electronically Signed   By: Charlett Nose M.D.   On: 05/03/2017 10:17   Dg C-arm 1-60 Min  Result Date: 05/03/2017 CLINICAL DATA:  Left hip replacement EXAM: OPERATIVE LEFT HIP (WITH PELVIS IF PERFORMED) 2 VIEWS TECHNIQUE: Fluoroscopic spot image(s) were submitted for interpretation post-operatively. COMPARISON:  03/09/2017 FINDINGS: Changes of left hip replacement. Normal AP alignment. No visible hardware or  bony complicating feature. IMPRESSION: Left hip replacement.  No visible complicating feature. Electronically Signed   By: Charlett Nose M.D.   On: 05/03/2017 09:19   Dg Hip Operative Unilat W Or W/o Pelvis Left  Result Date: 05/03/2017 CLINICAL DATA:  Left hip replacement EXAM: OPERATIVE LEFT HIP (WITH PELVIS IF PERFORMED) 2 VIEWS TECHNIQUE: Fluoroscopic spot image(s) were submitted for interpretation post-operatively. COMPARISON:  03/09/2017 FINDINGS: Changes of left hip replacement. Normal AP alignment. No visible hardware or bony complicating feature. IMPRESSION: Left hip replacement.  No visible complicating feature. Electronically Signed   By:  Charlett NoseKevin  Dover M.D.   On: 05/03/2017 09:19    Disposition: Final discharge disposition not confirmed    Follow-up Information    Tarry KosXu, Naiping M, MD In 2 weeks.   Specialty:  Orthopedic Surgery Why:  For suture removal, For wound re-check Contact information: [address] [place] [address] [phone]            Signed: Cristie HemMary L Deneshia Zucker 05/04/2017, 7:33 AM

## 2017-05-04 NOTE — Progress Notes (Signed)
Discharge teaching complete. Meds, diet, activity, follow up appointments and incision care reviewed and all questions answered. Copy of instructions and prescriptions given to patient. Patient waiting on daughter to come pick her up.

## 2017-05-04 NOTE — Progress Notes (Signed)
Subjective: 1 Day Post-Op Procedure(s) (LRB): LEFT TOTAL HIP ARTHROPLASTY ANTERIOR APPROACH (Left) Patient reports pain as mild.  Feeling well and mobilizing great with PT  Objective: Vital signs in last 24 hours: Temp:  [97.4 F (36.3 C)-99.5 F (37.5 C)] 99 F (37.2 C) (01/24 0338) Pulse Rate:  [45-76] 60 (01/24 0338) Resp:  [12-20] 18 (01/24 0338) BP: (98-141)/(75-93) 136/82 (01/24 0338) SpO2:  [100 %] 100 % (01/24 0338)  Intake/Output from previous day: 01/23 0701 - 01/24 0700 In: 1470 [P.O.:120; I.V.:1200] Out: 300 [Blood:300] Intake/Output this shift: No intake/output data recorded.  No results for input(s): HGB in the last 72 hours. No results for input(s): WBC, RBC, HCT, PLT in the last 72 hours. No results for input(s): NA, K, CL, CO2, BUN, CREATININE, GLUCOSE, CALCIUM in the last 72 hours. No results for input(s): LABPT, INR in the last 72 hours.  Neurologically intact Neurovascular intact Sensation intact distally Intact pulses distally Dorsiflexion/Plantar flexion intact Incision: dressing C/D/I No cellulitis present Compartment soft  Assessment/Plan: 1 Day Post-Op Procedure(s) (LRB): LEFT TOTAL HIP ARTHROPLASTY ANTERIOR APPROACH (Left) Advance diet Up with therapy D/C IV fluids Discharge home with home health after second session of PT WBAT LLE Dry dressing change prn  Cristie HemMary L Laray Corbit 05/04/2017, 7:31 AM

## 2017-05-04 NOTE — Care Management Note (Signed)
Case Management Note  Patient Details  Name: Terri Rasmussen MRN: [id number] Date of Birth: 08/13/1950  Subjective/Objective:  67 yr old female s/p left total hip arthroplasty.                   Action/Plan: Case manager spoke with patient concerning discharge plan and DME. Patient was preoperatively setup with [facility], no changes. She will have family support at discharge.     Expected Discharge Date:  05/04/17               Expected Discharge Plan:  Home w Home Health Services  In-House Referral:  NA  Discharge planning Services  CM Consult  Post Acute Care Choice:  Durable Medical Equipment, Home Health Choice offered to:  Patient  DME Arranged:  3-N-1, Walker rolling DME Agency:  [facility]  HH Arranged:  PT HH Agency:  [facility]  Status of Service:  Completed, signed off  If discussed at [employer] Meetings, dates discussed:    Additional Comments:  [name], [name], RN 05/04/2017, 10:41 AM

## 2017-05-05 ENCOUNTER — Telehealth (INDEPENDENT_AMBULATORY_CARE_PROVIDER_SITE_OTHER): Payer: Self-pay | Admitting: Orthopaedic Surgery

## 2017-05-05 NOTE — Telephone Encounter (Signed)
Corrie DandyMary -nurse case manager with [facility] called needing clarification on (PT) to eval and treat. Corrie DandyMary also advised patient is delaying start of care because she is waiting on insurance to respond. Patient think she will have a co-pay. The number to contact Corrie DandyMary is [phone]

## 2017-05-05 NOTE — Telephone Encounter (Signed)
Corrie DandyMary would like to let Dr. Roda ShuttersXu know they will not be able to open patient until 05/11/2017 and that's the date of verbal started care. She is faxing over a paper copy as well, if any questions CB # [phone]

## 2017-05-08 NOTE — Telephone Encounter (Signed)
See message below °

## 2017-05-08 NOTE — Telephone Encounter (Signed)
See message.

## 2017-05-08 NOTE — Telephone Encounter (Signed)
Eval and treat s/p hip replacement.  No hip precautions.  Gait training, balance, strengthening.  Please let patient know she needs to work on home exercises until PT can see her.

## 2017-05-08 NOTE — Telephone Encounter (Signed)
ok 

## 2017-05-09 NOTE — Telephone Encounter (Signed)
Usually the PT at the hospital gives patients exercises to do at home

## 2017-05-09 NOTE — Telephone Encounter (Signed)
Which exercises would you like for me to give patient?

## 2017-05-16 ENCOUNTER — Telehealth (INDEPENDENT_AMBULATORY_CARE_PROVIDER_SITE_OTHER): Payer: Self-pay | Admitting: *Deleted

## 2017-05-16 ENCOUNTER — Ambulatory Visit (INDEPENDENT_AMBULATORY_CARE_PROVIDER_SITE_OTHER): Payer: Non-veteran care | Admitting: Orthopaedic Surgery

## 2017-05-16 ENCOUNTER — Encounter (INDEPENDENT_AMBULATORY_CARE_PROVIDER_SITE_OTHER): Payer: Self-pay | Admitting: Orthopaedic Surgery

## 2017-05-16 VITALS — Ht 68.0 in | Wt 137.0 lb

## 2017-05-16 DIAGNOSIS — Z96642 Presence of left artificial hip joint: Secondary | ICD-10-CM

## 2017-05-16 NOTE — Telephone Encounter (Signed)
[facility] stating [facility] will be [facility] pt tomorrow for evaluation, states [place]. They got the approval.

## 2017-05-16 NOTE — Progress Notes (Signed)
   Post-Op Visit Note   Patient: Terri Rasmussen           Date of Birth: 11/13/1950           MRN: [id number] Visit Date: 05/16/2017 PCP: Patient, No Pcp Per   Assessment & Plan:  Chief Complaint:  Chief Complaint  Patient presents with  . Left Hip - Routine Post Op    05/03/17 total hip   Visit Diagnoses:  1. Status post total hip replacement, left     Plan: Terri Rasmussen comes in for follow-up.  13 days status post left anterior total hip replacement 05/03/2017.  Doing excellent.  Home health PT never came out to her house however because [place].  She is continued working on a home exercise program on her own.  No fevers no chills or any other systemic symptoms.  Minimal pain, occasionally taking a pain pill.  Examination of her left hip reveals a well-healing surgical incision without evidence of infection.  There does appear to be a small hematoma but this is not tense or tender.  She has full motion of her hip.  She is neurovascular intact distally.  At this point we are going to send Terri Rasmussen to 1 outpatient physical therapy session more or less for direction.  I do not think she will need more than one visit.  She will follow-up with us in 4 weeks time for repeat evaluation and x-ray.  She will call with questions or concerns in the meantime  Follow-Up Instructions: Return in about 4 weeks (around 06/13/2017).   Orders:  No orders of the defined types were placed in this encounter.  No orders of the defined types were placed in this encounter.   Imaging: No results found.  PMFS History: Patient Active Problem List   Diagnosis Date Noted  . 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   Past Medical History:  Diagnosis Date  . Arthritis     No family history on file.  Past Surgical History:  Procedure Laterality Date  . TOTAL HIP ARTHROPLASTY Left 05/03/2017  . TOTAL HIP ARTHROPLASTY Left 05/03/2017   Procedure: LEFT TOTAL HIP ARTHROPLASTY  ANTERIOR APPROACH;  Surgeon: Tarry KosXu, Naiping M, MD;  Location: [facility] OR;  Service: Orthopedics;  Laterality: Left;   Social History   Occupational History  . Not on file  Tobacco Use  . Smoking status: Never Smoker  . Smokeless tobacco: Never Used  Substance and Sexual Activity  . Alcohol use: Yes    Comment: beer  . Drug use: Yes    Types: Marijuana  . Sexual activity: Not on file

## 2017-05-17 ENCOUNTER — Telehealth (INDEPENDENT_AMBULATORY_CARE_PROVIDER_SITE_OTHER): Payer: Self-pay | Admitting: Radiology

## 2017-05-17 NOTE — Telephone Encounter (Signed)
Called Mark to advise on orders. LMOM

## 2017-05-17 NOTE — Telephone Encounter (Signed)
[facility]. HHPT 3x week x 1 and 2x week x 1.  Please advise.

## 2017-05-17 NOTE — Telephone Encounter (Signed)
yes

## 2017-05-17 NOTE — Telephone Encounter (Signed)
See message below. Is this okay?  

## 2017-06-13 ENCOUNTER — Telehealth (INDEPENDENT_AMBULATORY_CARE_PROVIDER_SITE_OTHER): Payer: Self-pay | Admitting: Orthopaedic Surgery

## 2017-06-13 ENCOUNTER — Ambulatory Visit (INDEPENDENT_AMBULATORY_CARE_PROVIDER_SITE_OTHER): Payer: Non-veteran care | Admitting: Orthopaedic Surgery

## 2017-06-13 ENCOUNTER — Ambulatory Visit (INDEPENDENT_AMBULATORY_CARE_PROVIDER_SITE_OTHER): Payer: Non-veteran care

## 2017-06-13 ENCOUNTER — Encounter (INDEPENDENT_AMBULATORY_CARE_PROVIDER_SITE_OTHER): Payer: Self-pay | Admitting: Orthopaedic Surgery

## 2017-06-13 DIAGNOSIS — Z96642 Presence of left artificial hip joint: Secondary | ICD-10-CM

## 2017-06-13 DIAGNOSIS — M25552 Pain in left hip: Secondary | ICD-10-CM | POA: Diagnosis not present

## 2017-06-13 NOTE — Progress Notes (Signed)
6 week THA follow up plan  Patient presents for follow up 6 weeks status post total hip replacement. The wound is healed and there is no evidence of infection. TED hose may be discontinued. Radiographs reveal a total hip arthroplasty in good position, with no evidence of subsidence, loosening, or complicating features. It was reinforced that with any procedure including dental work, colonoscopy, or any invasive procedure that pre-procedural prophylactic antibiotics must be taken to decrease the risk of infection. Reminders were given about signs to be aware of including redness, drainage, increased pain, fevers, calf pain, shortness of breath, or any concern should generate a phone call or a return to see us immediately. Will plan to follow up at 3 months postoperatively for next evaluation with radiographs at that time. 

## 2017-06-13 NOTE — Telephone Encounter (Signed)
Is this okay? You saw her this morning.

## 2017-06-13 NOTE — Telephone Encounter (Signed)
Called patient to advise  °

## 2017-06-13 NOTE — Telephone Encounter (Signed)
Terri Rasmussen,  This patient was here this morning and forgot to ask XU if she could drive.  Please call her to advise

## 2017-06-13 NOTE — Telephone Encounter (Signed)
Definitely ok

## 2017-07-25 ENCOUNTER — Ambulatory Visit (INDEPENDENT_AMBULATORY_CARE_PROVIDER_SITE_OTHER): Payer: Non-veteran care | Admitting: Orthopaedic Surgery

## 2017-07-25 ENCOUNTER — Ambulatory Visit (INDEPENDENT_AMBULATORY_CARE_PROVIDER_SITE_OTHER): Payer: Non-veteran care

## 2017-07-25 ENCOUNTER — Encounter (INDEPENDENT_AMBULATORY_CARE_PROVIDER_SITE_OTHER): Payer: Self-pay | Admitting: Orthopaedic Surgery

## 2017-07-25 DIAGNOSIS — Z96642 Presence of left artificial hip joint: Secondary | ICD-10-CM

## 2017-07-25 NOTE — Progress Notes (Signed)
   Post-Op Visit Note   Patient: Terri Rasmussen           Date of Birth: 01/14/1951           MRN: [id number] Visit Date: 07/25/2017 PCP: Patient, No Pcp Per   Assessment & Plan:  Chief Complaint:  Chief Complaint  Patient presents with  . Left Hip - Pain, Follow-up   Visit Diagnoses:  1. Status post total hip replacement, left     Plan: 3 month THA follow up plan  Patient now 3 months status post left total hip arthroplasty. Wound is healed with no signs of complications or infection.  The patient does not complain of pain, and is back to normal daily activities. Radiographs reveal a total hip arthroplasty in good position, with no evidence of subsidence, loosening, or complicating features. It was reinforced that prophylactic antibiotics should be taken with any procedure including but not limited to dental work or colonoscopies.  We will plan on following up at the 6 month postop visit with radiographs at that time. As always, instructions were given to call with any questions or concerns in the interim.   Follow-Up Instructions: Return in about 3 months (around 10/24/2017).   Orders:  Orders Placed This Encounter  Procedures  . XR HIP UNILAT W OR W/O PELVIS 2-3 VIEWS LEFT   No orders of the defined types were placed in this encounter.   Imaging: Xr Hip Unilat W Or W/o Pelvis 2-3 Views Left  Result Date: 07/25/2017 Stable total hip replacement without complication   PMFS History: Patient Active Problem List   Diagnosis Date Noted  . 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   Past Medical History:  Diagnosis Date  . Arthritis     History reviewed. No pertinent family history.  Past Surgical History:  Procedure Laterality Date  . TOTAL HIP ARTHROPLASTY Left 05/03/2017  . TOTAL HIP ARTHROPLASTY Left 05/03/2017   Procedure: LEFT TOTAL HIP ARTHROPLASTY ANTERIOR APPROACH;  Surgeon: Tarry KosXu, Randee Huston M, MD;  Location: [facility] OR;  Service: Orthopedics;  Laterality:  Left;   Social History   Occupational History  . Not on file  Tobacco Use  . Smoking status: Never Smoker  . Smokeless tobacco: Never Used  Substance and Sexual Activity  . Alcohol use: Yes    Comment: beer  . Drug use: Yes    Types: Marijuana  . Sexual activity: Not on file

## 2017-10-24 ENCOUNTER — Ambulatory Visit (INDEPENDENT_AMBULATORY_CARE_PROVIDER_SITE_OTHER): Payer: Medicare Other

## 2017-10-24 ENCOUNTER — Ambulatory Visit (INDEPENDENT_AMBULATORY_CARE_PROVIDER_SITE_OTHER): Payer: Medicare Other | Admitting: Orthopaedic Surgery

## 2017-10-24 VITALS — Ht 68.0 in | Wt 137.0 lb

## 2017-10-24 DIAGNOSIS — Z96642 Presence of left artificial hip joint: Secondary | ICD-10-CM

## 2017-10-24 NOTE — Progress Notes (Addendum)
   Office Visit Note   Patient: Terri CarwinVanessa Mckercher           Date of Birth: 04/09/1951           MRN: [id number] Visit Date: 10/24/2017              Requested by: No referring provider defined for this encounter. PCP: Patient, No Pcp Per   Assessment & Plan: Visit Diagnoses:  1. Status post total hip replacement, left     Plan: 6 month THA follow up plan  Patient now 6 months status post total hip arthroplasty. Wound is healed with no signs of complications or infection.  The patient does not complain of pain, and is back to normal daily activities. Radiographs reveal a total hip arthroplasty in good position, with no evidence of subsidence, loosening, or complicating features. It was reinforced that prophylactic antibiotics should be taken with any procedure including but not limited to dental work or colonoscopies.  We will plan on following up at the 12 month postop visit with radiographs at that time. As always, instructions were given to call with any questions or concerns in the interim.   Follow-Up Instructions: Return in about 6 months (around 04/26/2018).   Orders:  Orders Placed This Encounter  Procedures  . XR HIP UNILAT W OR W/O PELVIS 2-3 VIEWS LEFT   No orders of the defined types were placed in this encounter.     Procedures: No procedures performed   Clinical Data: No additional findings.   Subjective: Chief Complaint  Patient presents with  . Left Hip - Follow-up    05/03/17 left total hip     Patient is 6 months status post left total hip replacement.  She is doing well.  She has no complaints.   Review of Systems   Objective: Vital Signs: Ht 5\' 8"  (1.727 m)   Wt 137 lb (62.1 kg)   BMI 20.83 kg/m   Physical Exam  Ortho Exam Left hip exam is unremarkable.  Fully healed surgical scar. Specialty Comments:  No specialty comments available.  Imaging: Xr Hip Unilat W Or W/o Pelvis 2-3 Views Left  Result Date: 10/24/2017 Stable left total hip  replacement without complication    PMFS History: Patient Active Problem List   Diagnosis Date Noted  . 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   Past Medical History:  Diagnosis Date  . Arthritis     No family history on file.  Past Surgical History:  Procedure Laterality Date  . TOTAL HIP ARTHROPLASTY Left 05/03/2017  . TOTAL HIP ARTHROPLASTY Left 05/03/2017   Procedure: LEFT TOTAL HIP ARTHROPLASTY ANTERIOR APPROACH;  Surgeon: Tarry KosXu, Naiping M, MD;  Location: [facility] OR;  Service: Orthopedics;  Laterality: Left;   Social History   Occupational History  . Not on file  Tobacco Use  . Smoking status: Never Smoker  . Smokeless tobacco: Never Used  Substance and Sexual Activity  . Alcohol use: Yes    Comment: beer  . Drug use: Yes    Types: Marijuana  . Sexual activity: Not on file

## 2018-04-16 ENCOUNTER — Other Ambulatory Visit: Payer: Self-pay | Admitting: Internal Medicine

## 2018-04-16 DIAGNOSIS — Z1231 Encounter for screening mammogram for malignant neoplasm of breast: Secondary | ICD-10-CM

## 2018-04-16 DIAGNOSIS — Z13820 Encounter for screening for osteoporosis: Secondary | ICD-10-CM

## 2018-04-26 ENCOUNTER — Encounter (INDEPENDENT_AMBULATORY_CARE_PROVIDER_SITE_OTHER): Payer: Self-pay | Admitting: Orthopaedic Surgery

## 2018-04-26 ENCOUNTER — Ambulatory Visit (INDEPENDENT_AMBULATORY_CARE_PROVIDER_SITE_OTHER): Payer: Medicare Other | Admitting: Orthopaedic Surgery

## 2018-04-26 ENCOUNTER — Ambulatory Visit (INDEPENDENT_AMBULATORY_CARE_PROVIDER_SITE_OTHER): Payer: Medicare Other

## 2018-04-26 DIAGNOSIS — Z96642 Presence of left artificial hip joint: Secondary | ICD-10-CM | POA: Diagnosis not present

## 2018-04-26 NOTE — Progress Notes (Signed)
   Office Visit Note   Patient: Terri Rasmussen           Date of Birth: 1950-04-21           MRN: [id number] Visit Date: 04/26/2018              Requested by: No referring provider defined for this encounter. PCP: Patient, No Pcp Per   Assessment & Plan: Visit Diagnoses:  1. Status post total hip replacement, left     Plan: Patient is doing very well from my standpoint.  He is 1 year out from left total hip replacement.  At this point we will see her back in another year for her 2-year visit.  She will need standing AP pelvis and lateral hip on return.  Follow-Up Instructions: Return in about 1 year (around 04/27/2019).   Orders:  Orders Placed This Encounter  Procedures  . XR HIP UNILAT W OR W/O PELVIS 2-3 VIEWS LEFT   No orders of the defined types were placed in this encounter.     Procedures: No procedures performed   Clinical Data: No additional findings.   Subjective: Chief Complaint  Patient presents with  . Left Hip - Pain    Terri Rasmussen is 1 year status post left total hip replacement.  She is doing very well and reports no pain.  She is very happy.  She is not taking pain medicines.   Review of Systems   Objective: Vital Signs: There were no vitals taken for this visit.  Physical Exam  Ortho Exam Left hip exam shows a fully healed surgical scar.  Otherwise unremarkable.  She ambulates without any assistive devices. Specialty Comments:  No specialty comments available.  Imaging: Xr Hip Unilat W Or W/o Pelvis 2-3 Views Left  Result Date: 04/26/2018 Well-seated left total hip replacement with ingrowth.  No complications.    PMFS History: Patient Active Problem List   Diagnosis Date Noted  . 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   Past Medical History:  Diagnosis Date  . Arthritis     History reviewed. No pertinent family history.  Past Surgical History:  Procedure Laterality Date  . TOTAL HIP ARTHROPLASTY Left 05/03/2017  .  TOTAL HIP ARTHROPLASTY Left 05/03/2017   Procedure: LEFT TOTAL HIP ARTHROPLASTY ANTERIOR APPROACH;  Surgeon: Tarry Kos, MD;  Location: [facility] OR;  Service: Orthopedics;  Laterality: Left;   Social History   Occupational History  . Not on file  Tobacco Use  . Smoking status: Never Smoker  . Smokeless tobacco: Never Used  Substance and Sexual Activity  . Alcohol use: Yes    Comment: beer  . Drug use: Yes    Types: Marijuana  . Sexual activity: Not on file

## 2018-06-08 ENCOUNTER — Ambulatory Visit
Admission: RE | Admit: 2018-06-08 | Discharge: 2018-06-08 | Disposition: A | Discharge status: Home / Self Care | Payer: Medicare Other | Source: Ambulatory Visit | Attending: Internal Medicine | Admitting: Internal Medicine

## 2018-06-08 DIAGNOSIS — Z1231 Encounter for screening mammogram for malignant neoplasm of breast: Secondary | ICD-10-CM

## 2018-06-08 DIAGNOSIS — Z13820 Encounter for screening for osteoporosis: Secondary | ICD-10-CM

## 2018-06-12 ENCOUNTER — Telehealth: Payer: Self-pay | Admitting: *Deleted

## 2018-06-12 NOTE — Telephone Encounter (Signed)
Referral sent to scheduling for treadmill and notes on file

## 2018-06-20 ENCOUNTER — Encounter: Payer: Self-pay | Admitting: *Deleted

## 2018-06-27 ENCOUNTER — Telehealth: Payer: Self-pay | Admitting: Cardiology

## 2018-06-27 DIAGNOSIS — M25519 Pain in unspecified shoulder: Secondary | ICD-10-CM

## 2018-06-27 DIAGNOSIS — I739 Peripheral vascular disease, unspecified: Secondary | ICD-10-CM

## 2018-06-27 HISTORY — DX: Peripheral vascular disease, unspecified: I73.9

## 2018-06-27 HISTORY — DX: Pain in unspecified shoulder: M25.519

## 2018-06-27 NOTE — Telephone Encounter (Signed)
Patient was called today to reschedule her appointment from 06/27/2020 8 later date due to the coronavirus pandemic.  The patient was initially referred by her PCP for some mild chest pain and tightness that had been reported at an office visit on January 22.  She was seen back by her PCP in February and actually had stated that she had not had any further chest discomfort.  Her EKG was reviewed by me today and was normal.  They had recommended a stress echo but she had not been set up yet.  I personally talked to the patient today and she has not been having any further chest pain since that episode in January.  Her EKG is normal.  I have recommended rescheduling her in 4 weeks for follow-up.  She was instructed to call if she has any recurrence of her symptoms.

## 2018-06-28 ENCOUNTER — Ambulatory Visit: Payer: Medicare Other | Admitting: Cardiology

## 2018-07-27 NOTE — Telephone Encounter (Signed)
Working out of the [employer] pool. Call placed to pt to try to reschedule her new pt appt that was cancelled in March with Dr. Mayford Knife.  When pt calls back, please put her on the 1st available new pt appt coming up.  (look at 07/31/2018).

## 2018-08-10 ENCOUNTER — Telehealth: Payer: Self-pay

## 2018-08-10 NOTE — Telephone Encounter (Signed)
Pt does NOT have smart phone. Unable to do VIDEO.   Virtual Visit Pre-Appointment Phone Call  "(Name), I am calling you today to discuss your upcoming appointment. We are currently trying to limit exposure to the virus that causes COVID-19 by seeing patients at home rather than in the office."  1. "What is the BEST phone number to call the day of the visit?" - include this in appointment notes  2. Do you have or have access to (through a family member/friend) a smartphone with video capability that we can use for your visit?" a. If yes - list this number in appt notes as cell (if different from BEST phone #) and list the appointment type as a VIDEO visit in appointment notes b. If no - list the appointment type as a PHONE visit in appointment notes  3. Confirm consent - "In the setting of the current Covid19 crisis, you are scheduled for a PHONE visit with your provider on 5/8 at 930am.  Just as we do with many in-office visits, in order for you to participate in this visit, we must obtain consent.  If you'd like, I can send this to your mychart (if signed up) or email for you to review.  Otherwise, I can obtain your verbal consent now.  All virtual visits are billed to your insurance company just like a normal visit would be.  By agreeing to a virtual visit, we'd like you to understand that the technology does not allow for your provider to perform an examination, and thus may limit your provider's ability to fully assess your condition. If your provider identifies any concerns that need to be evaluated in person, we will make arrangements to do so.  Finally, though the technology is pretty good, we cannot assure that it will always work on either your or our end, and in the setting of a video visit, we may have to convert it to a phone-only visit.  In either situation, we cannot ensure that we have a secure connection.  Are you willing to proceed?" STAFF: Did the patient verbally acknowledge  consent to telehealth visit? Document YES/NO here: YES  4. Advise patient to be prepared - "Two hours prior to your appointment, go ahead and check your blood pressure, pulse, oxygen saturation, and your weight (if you have the equipment to check those) and write them all down. When your visit starts, your provider will ask you for this information. If you have an Apple Watch or Kardia device, please plan to have heart rate information ready on the day of your appointment. Please have a pen and paper handy nearby the day of the visit as well."  5. Give patient instructions for MyChart download to smartphone OR [email or web address] as below if video visit (depending on what platform provider is using)  6. Inform patient they will receive a phone call 15 minutes prior to their appointment time (may be from unknown caller ID) so they should be prepared to answer    TELEPHONE CALL NOTE  Terri Rasmussen has been deemed a candidate for a follow-up tele-health visit to limit community exposure during the Covid-19 pandemic. I spoke with the patient via phone to ensure availability of phone/video source, confirm preferred email & phone number, and discuss instructions and expectations.  I reminded Terri Rasmussen to be prepared with any vital sign and/or heart rhythm information that could potentially be obtained via home monitoring, at the time of her visit. I reminded Terri Rasmussen to  expect a phone call prior to her visit.  Leanord Hawking, RN 08/10/2018 12:09 PM   .

## 2018-08-17 ENCOUNTER — Encounter: Payer: Self-pay | Admitting: Internal Medicine

## 2018-08-17 ENCOUNTER — Telehealth (INDEPENDENT_AMBULATORY_CARE_PROVIDER_SITE_OTHER): Payer: Medicare Other | Admitting: Internal Medicine

## 2018-08-17 ENCOUNTER — Other Ambulatory Visit: Payer: Self-pay

## 2018-08-17 VITALS — BP 140/91 | HR 61 | Ht 68.0 in | Wt 138.0 lb

## 2018-08-17 DIAGNOSIS — R079 Chest pain, unspecified: Secondary | ICD-10-CM

## 2018-08-17 DIAGNOSIS — R0789 Other chest pain: Secondary | ICD-10-CM

## 2018-08-17 DIAGNOSIS — I10 Essential (primary) hypertension: Secondary | ICD-10-CM | POA: Diagnosis not present

## 2018-08-17 MED ORDER — OMEPRAZOLE 20 MG PO CPDR: 20.0000 mg | DELAYED_RELEASE_CAPSULE | Freq: Every day | ORAL | 11 refills | Status: AC

## 2018-08-17 NOTE — Patient Instructions (Signed)
Medication Instructions:  Your physician has recommended you make the following change in your medication:  1.) start omeprazole 20 mg once a day  If you need a refill on your cardiac medications before your next appointment, please call your pharmacy.   Lab work: none If you have labs (blood work) drawn today and your tests are completely normal, you will receive your results only by: Marland Kitchen MyChart Message (if you have MyChart) OR . A paper copy in the mail If you have any lab test that is abnormal or we need to change your treatment, we will call you to review the results.  Testing/Procedures: none  . Follow-Up: . We will plan to see you again in about 4 weeks.  The office will contact you to make this appointment  Any Other Special Instructions Will Be Listed Below (If Applicable). Please keep track of your blood pressure at home.  Keep a list.  Bring your list of readings to next appointment.

## 2018-08-17 NOTE — Addendum Note (Signed)
Addended by: Lendon Ka on: 08/17/2018 10:39 AM   Modules accepted: Orders

## 2018-08-17 NOTE — Progress Notes (Signed)
Virtual Visit via Telephone Note   This visit type was conducted due to national recommendations for restrictions regarding the COVID-19 Pandemic (e.g. social distancing) in an effort to limit this patient's exposure and mitigate transmission in our community.  Due to her co-morbid illnesses, this patient is at least at moderate risk for complications without adequate follow up.  This format is felt to be most appropriate for this patient at this time.  The patient did not have access to video technology/had technical difficulties with video requiring transitioning to audio format only (telephone).  All issues noted in this document were discussed and addressed.  No physical exam could be performed with this format.  Please refer to the patient's chart for her  consent to telehealth for [facility].   Date:  08/17/2018   ID:  Terri Rasmussen, DOB 01/04/1951, MRN [id number]  Patient Location: Home Provider Location: Home  PCP:  Patient, No Pcp Per  Cardiologist:   New  Evaluation Performed:  Consultation - Terri Rasmussen was referred by Dr Senaida Oresichardson  for the evaluation of chest pain    Chief Complaint:  Referred for CP   History of Present Illness:    Terri Rasmussen is a 68 y.o. female with history of chst discomfort   Seen by PCP in Jan 22.  Seen again in Feb   Stated no furhter discomfort   Stress echo recommended T Turner spoke to pt on Mar 18   No CP   EKG normal    Talking to patient she has a hx of reported reflux with CP   She does not take anything   Spells go away on own  Feels OK after    Spells of discomfort she has can come on without activity even in bed  WIll get a little SOB  Like hot flash   Goes away in about 5 min   OK after  Last spell 1 wk ago  Pt loves to walk   Never gets pain with walk   NO SOB or tightness with walking    No FHx of CAD   Hx positive for HTN   Pt was on BP med but ran out  NOt on anything now  The patient does not  have symptoms concerning  for COVID-19 infection (fever, chills, cough, or new shortness of breath).    Past Medical History:  Diagnosis Date  . Arthritis   . Essential hypertension 03/23/2016   Last Assessment & Plan:  Last routine visit several years ago, no previous history of hypertension. Blood pressure elevated today, lower when rechecked; initial systolic blood pressure was 166. We'll recheck in 4 weeks.  . H/O chest pain   . PAD (peripheral artery disease) (HCC) 06/27/2018  . Shoulder pain 06/27/2018  . 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 TOTAL HIP ARTHROPLASTY Left 05/03/2017  . TOTAL HIP ARTHROPLASTY Left 05/03/2017   Procedure: LEFT TOTAL HIP ARTHROPLASTY ANTERIOR APPROACH;  Surgeon: Tarry KosXu, Naiping M, MD;  Location: [facility] OR;  Service: Orthopedics;  Laterality: Left;     No outpatient medications have been marked as taking for the 08/17/18 encounter (Telemedicine) with Pricilla Riffleoss, Mahamud Metts V, MD.     Allergies:   Patient has no known allergies.   Social History   Tobacco Use  . Smoking status: Never Smoker  . Smokeless tobacco: Never Used  Substance Use Topics  . Alcohol use: Yes    Comment:  beer  . Drug use: Yes    Types: Marijuana     Family Hx: The patient's family history is not on file.  ROS:   Please see the history of present illness.     All other systems reviewed and are negative.   Prior CV studies:   The following studies were reviewed today: None  Labs/Other Tests and Data Reviewed:    EKG:  Not done   Tele visit ON 05/02/18:  SInus bradycardia   Incomp RBBB  Recent Labs: No results found for requested labs within last 8760 hours.   Recent Lipid Panel No results found for: CHOL, TRIG, HDL, CHOLHDL, LDLCALC, LDLDIRECT  Wt Readings from Last 3 Encounters:  08/17/18 138 lb (62.6 kg)  10/24/17 137 lb (62.1 kg)  05/16/17 137 lb (62.1 kg)     Objective:    Vital Signs:  BP (!) 140/91   Pulse 61   Ht 5\' 8"   (1.727 m)   Wt 138 lb (62.6 kg)   BMI 20.98 kg/m    VITAL SIGNS:  reviewed  ASSESSMENT & PLAN:    1  Chest pain  Somewhat atypical Not associated with activity  SHe is not on anything for reflux   I would prescribe omeprazole daily to see if eliminates  Keep record of spells If still having on omeprazole consider other testing   2  HTN  Patient had been on a BP med  Not now   BP is a little high   I have asked her to keep records of readings and bring into clinic for review  Set follow up for 4 wks to review response and readings     COVID-19 Education: The signs and symptoms of COVID-19 were discussed with the patient and how to seek care for testing (follow up with PCP or arrange E-visit).  The importance of social distancing was discussed today.  Time:   Today, I have spent 20 minutes with the patient with telehealth technology discussing the above problems.     Medication Adjustments/Labs and Tests Ordered: Current medicines are reviewed at length with the patient today.  Concerns regarding medicines are outlined above.   Tests Ordered: No orders of the defined types were placed in this encounter.   Medication Changes: No orders of the defined types were placed in this encounter.   Disposition:  Follow up in June  Signed, Assia Meanor, MD  08/17/2018 9:51 AM    [facility]

## 2019-04-26 ENCOUNTER — Ambulatory Visit: Payer: Medicare Other | Admitting: Orthopaedic Surgery

## 2019-05-02 ENCOUNTER — Other Ambulatory Visit: Payer: Self-pay | Admitting: Internal Medicine

## 2019-05-02 DIAGNOSIS — Z1231 Encounter for screening mammogram for malignant neoplasm of breast: Secondary | ICD-10-CM

## 2019-06-11 ENCOUNTER — Other Ambulatory Visit: Payer: Self-pay

## 2019-06-11 ENCOUNTER — Ambulatory Visit
Admission: RE | Admit: 2019-06-11 | Discharge: 2019-06-11 | Disposition: A | Discharge status: Home / Self Care | Payer: Medicare Other | Source: Ambulatory Visit | Attending: Internal Medicine | Admitting: Internal Medicine

## 2019-06-11 DIAGNOSIS — Z1231 Encounter for screening mammogram for malignant neoplasm of breast: Secondary | ICD-10-CM

## 2020-05-19 ENCOUNTER — Encounter: Payer: Self-pay | Admitting: Physical Medicine & Rehabilitation

## 2020-06-12 ENCOUNTER — Encounter: Payer: Medicare Other | Attending: Physical Medicine & Rehabilitation | Admitting: Physical Medicine & Rehabilitation

## 2020-06-12 ENCOUNTER — Other Ambulatory Visit: Payer: Self-pay

## 2020-06-12 ENCOUNTER — Encounter: Payer: Self-pay | Admitting: Physical Medicine & Rehabilitation

## 2020-06-12 VITALS — BP 153/91 | HR 58 | Temp 97.8°F | Ht 69.0 in | Wt 146.0 lb

## 2020-06-12 DIAGNOSIS — M7501 Adhesive capsulitis of right shoulder: Secondary | ICD-10-CM | POA: Diagnosis not present

## 2020-06-12 NOTE — Progress Notes (Signed)
Subjective:    Patient ID: Terri Rasmussen, female    DOB: [birth year], 70 y.o.   MRN: [id number]  HPI CC: Right shoulder pain  70 year old female referred by primary care for the evaluation of right shoulder pain.  She has a history of osteoarthritis of the left hip and has had left hip joint arthroplasty.  She has recovered well from her arthroplasty but now is concerned about arthritis in other parts of her body. 1 yr hx of Right shoulder pain , takes occ naproxen (aleve) maybe once every 2 weeks No injections or PT .  Occ neck pain on right side, massages that area, but this started a couple years ago.   Left shoulder cracking but no pain.  The pain has disturbed her sleep at night but not on a daily basis.  The patient denies any falls or trauma to the area.  No prior surgery in the shoulder or cervical spine.  The patient does not have any numbness or tingling in the right shoulder.   CKD while taking regular ibuprofen or aleve prior to Left hip replacement  Pain Inventory Average Pain 3 Pain Right Now 3 My pain is no pain  In the last 24 hours, has pain interfered with the following? General activity 0 Relation with others 0 Enjoyment of life 0 What TIME of day is your pain at its worst? varies Sleep (in general) Good  Pain is worse with: some activites Pain improves with: rest Relief from Meds: ?  walk without assistance how many minutes can you walk? 30 ability to climb steps?  yes do you drive?  yes  retired  No problems in this area  new  new    History reviewed. No pertinent family history. Social History   Socioeconomic History  . Marital status: Legally Separated    Spouse name: Not on file  . Number of children: Not on file  . Years of education: Not on file  . Highest education level: Not on file  Occupational History  . Not on file  Tobacco Use  . Smoking status: Never Smoker  . Smokeless tobacco: Never Used  Vaping Use  . Vaping Use: Never  used  Substance and Sexual Activity  . Alcohol use: Yes    Comment: beer  . Drug use: Yes    Types: Marijuana  . Sexual activity: Not on file  Other Topics Concern  . Not on file  Social History Narrative  . Not on file   Social Determinants of Health   Financial Resource Strain: Not on file  Food Insecurity: Not on file  Transportation Needs: Not on file  Physical Activity: Not on file  Stress: Not on file  Social Connections: Not on file   Past Surgical History:  Procedure Laterality Date  . TOTAL HIP ARTHROPLASTY Left 05/03/2017  . TOTAL HIP ARTHROPLASTY Left 05/03/2017   Procedure: LEFT TOTAL HIP ARTHROPLASTY ANTERIOR APPROACH;  Surgeon: Tarry Kos, MD;  Location: [facility] OR;  Service: Orthopedics;  Laterality: Left;   Past Medical History:  Diagnosis Date  . Arthritis   . Essential hypertension 03/23/2016   Last Assessment & Plan:  Last routine visit several years ago, no previous history of hypertension. Blood pressure elevated today, lower when rechecked; initial systolic blood pressure was 166. We'll recheck in 4 weeks.  . H/O chest pain   . PAD (peripheral artery disease) (HCC) 06/27/2018  . Shoulder pain 06/27/2018  . 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  BP (!) 153/91   Pulse (!) 58   Temp 97.8 F (36.6 C)   Ht 5\' 9"  (1.753 m)   Wt 146 lb (66.2 kg)   SpO2 97%   BMI 21.56 kg/m   Opioid Risk Score:   Fall Risk Score:  `1  Depression screen PHQ 2/9  Depression screen PHQ 2/9 06/12/2020  Decreased Interest 0  Down, Depressed, Hopeless 0  PHQ - 2 Score 0  Altered sleeping 0  Tired, decreased energy 0  Change in appetite 0  Feeling bad or failure about yourself  0  Trouble concentrating 0  Moving slowly or fidgety/restless 0  Suicidal thoughts 0  PHQ-9 Score 0  Difficult doing work/chores Not difficult at all    Review of Systems  Constitutional: Negative.   HENT: Negative.   Eyes: Negative.   Respiratory: Negative.   Cardiovascular:  Negative.   Gastrointestinal: Negative.   Endocrine: Negative.   Genitourinary: Negative.   Musculoskeletal: Positive for arthralgias.  Skin: Negative.   Allergic/Immunologic: Negative.   Neurological: Negative.   Hematological: Negative.   Psychiatric/Behavioral: Negative.   All other systems reviewed and are negative.      Objective:   Physical Exam Vitals and nursing note reviewed.  Constitutional:      Appearance: She is normal weight.  HENT:     Head: Normocephalic and atraumatic.  Eyes:     Extraocular Movements: Extraocular movements intact.     Conjunctiva/sclera: Conjunctivae normal.     Pupils: Pupils are equal, round, and reactive to light.  Cardiovascular:     Rate and Rhythm: Normal rate and regular rhythm.     Heart sounds: Normal heart sounds. No murmur heard.   Pulmonary:     Effort: Pulmonary effort is normal. No respiratory distress.     Breath sounds: Normal breath sounds. No stridor.  Abdominal:     General: Abdomen is flat. Bowel sounds are normal. There is no distension.     Palpations: Abdomen is soft.  Musculoskeletal:        General: No tenderness.     Cervical back: Normal range of motion. No rigidity or tenderness.     Right lower leg: No edema.     Left lower leg: No edema.     Comments: Positive 08/12/2020 on the right side. Reduced external rotation of the right shoulder compared to the left side.  No tenderness palpation over the [facility] joint or the subacromial area.  No pain to palpation over the paraspinal or parascapular musculature.  Skin:    General: Skin is warm and dry.  Neurological:     Mental Status: She is alert and oriented to person, place, and time.     Comments: Motor strength is 5/5 bilateral deltoid, bicep, tricep, grip, hip flexor, knee extension, ankle dorsiflexion plantarflexion Gait without evidence to drag or knee instability Sensation intact bilateral upper extremities in the C5-C6-C7 dermatomal  distribution. Negative foraminal compression test bilaterally.  Psychiatric:        Mood and Affect: Mood normal.        Behavior: Behavior normal.   Negative drop arm test        Assessment & Plan:  #1.  Right shoulder pain with reduced range of motion particularly external rotation.  This is consistent with adhesive capsulitis.  In addition she does have been impingement signs and given her age would expect some degree of rotator cuff degeneration.  The patient has history of osteoarthritis of the hip  and certainly may be developing some in her shoulders.  Will check x-rays. Have given her a AOS rotator cuff and shoulder conditioning program.  We will start out with stretching and band exercises hold off on dumbbells Return to clinic in 1 month if no better consider corticosteroid injection plus referral to physical therapy

## 2020-06-12 NOTE — Patient Instructions (Addendum)
[place] [facility] [address] for [employer] any time   Do exercises on shoulder sheet

## 2020-07-14 ENCOUNTER — Ambulatory Visit: Payer: Medicare Other | Admitting: Physical Medicine & Rehabilitation

## 2020-07-31 ENCOUNTER — Other Ambulatory Visit: Payer: Self-pay | Admitting: General Practice

## 2020-07-31 DIAGNOSIS — Z1231 Encounter for screening mammogram for malignant neoplasm of breast: Secondary | ICD-10-CM

## 2020-08-21 ENCOUNTER — Ambulatory Visit
Admission: RE | Admit: 2020-08-21 | Discharge: 2020-08-21 | Disposition: A | Discharge status: Home / Self Care | Payer: Medicare Other | Source: Ambulatory Visit | Attending: General Practice | Admitting: General Practice

## 2020-08-21 DIAGNOSIS — Z1231 Encounter for screening mammogram for malignant neoplasm of breast: Secondary | ICD-10-CM

## 2021-11-05 ENCOUNTER — Other Ambulatory Visit: Payer: Self-pay | Admitting: Student

## 2021-11-05 DIAGNOSIS — Z1231 Encounter for screening mammogram for malignant neoplasm of breast: Secondary | ICD-10-CM

## 2021-11-09 ENCOUNTER — Ambulatory Visit
Admission: RE | Admit: 2021-11-09 | Discharge: 2021-11-09 | Disposition: A | Discharge status: Home / Self Care | Payer: Medicare Other | Source: Ambulatory Visit | Attending: Student | Admitting: Student

## 2021-11-09 DIAGNOSIS — Z1231 Encounter for screening mammogram for malignant neoplasm of breast: Secondary | ICD-10-CM

## 2022-09-18 IMAGING — MG MM DIGITAL SCREENING BILAT W/ TOMO AND CAD
8 series · 9 of 24 positions shown · non-contrast
Comparison: Previous exam(s).

CLINICAL DATA: Screening.

EXAM:
DIGITAL SCREENING BILATERAL MAMMOGRAM WITH TOMOSYNTHESIS AND CAD
TECHNIQUE: Bilateral screening digital craniocaudal and mediolateral oblique
mammograms were obtained. Bilateral screening digital breast
tomosynthesis was performed. The images were evaluated with
computer-aided detection.

[L CC synth-2D]
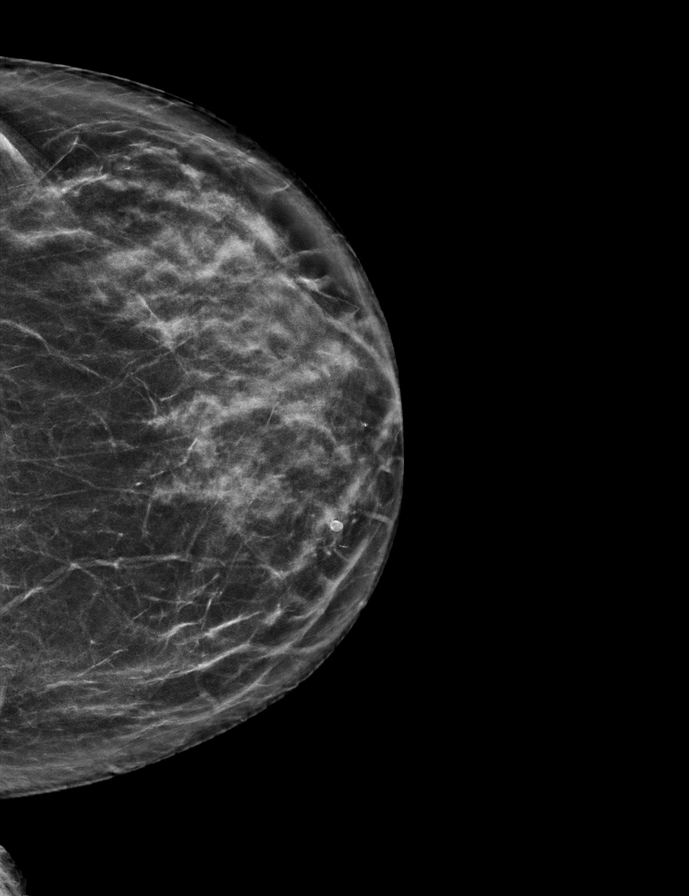

[R MLO synth-2D]
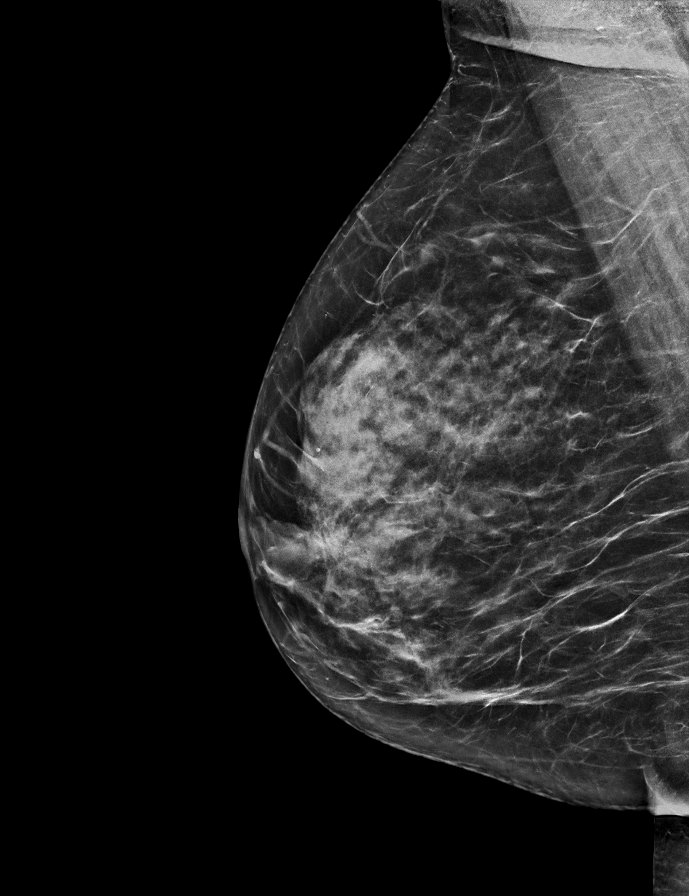

[L MLO synth-2D]
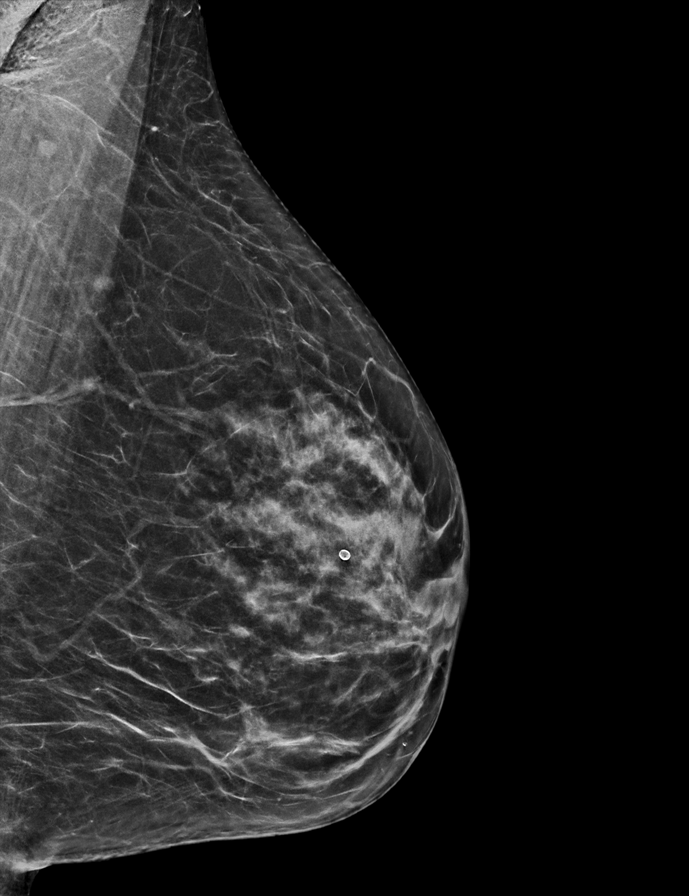

[R CC synth-2D]
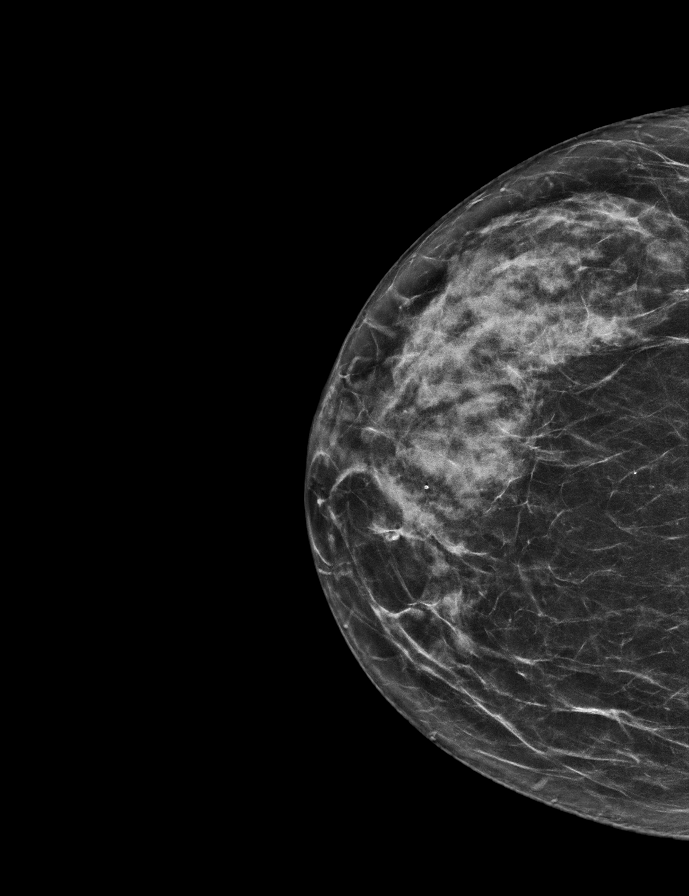

[R MLO tomo · 2 of 64 frames shown]
[frame 21/64]
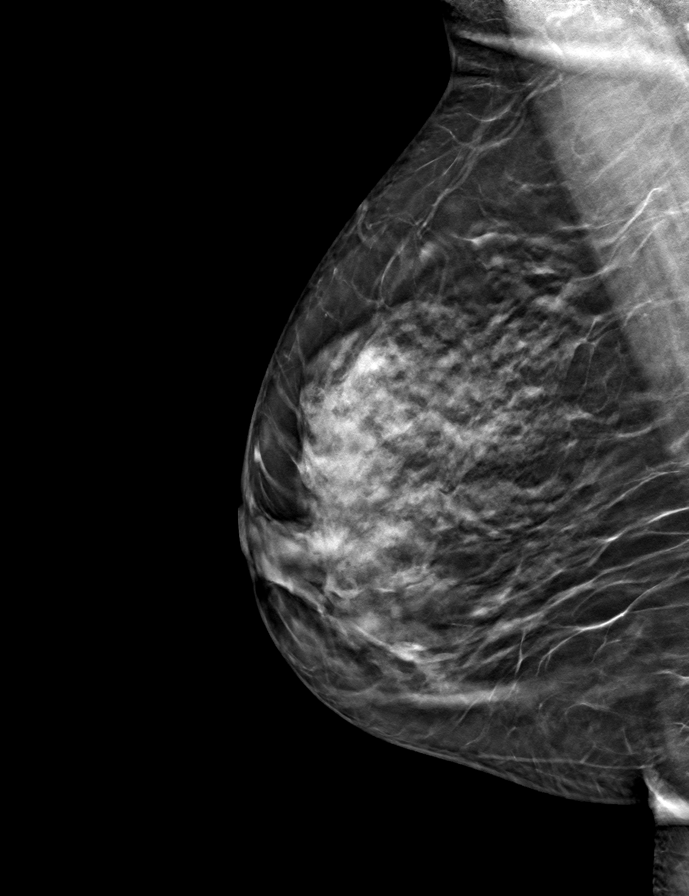
[frame 33/64]
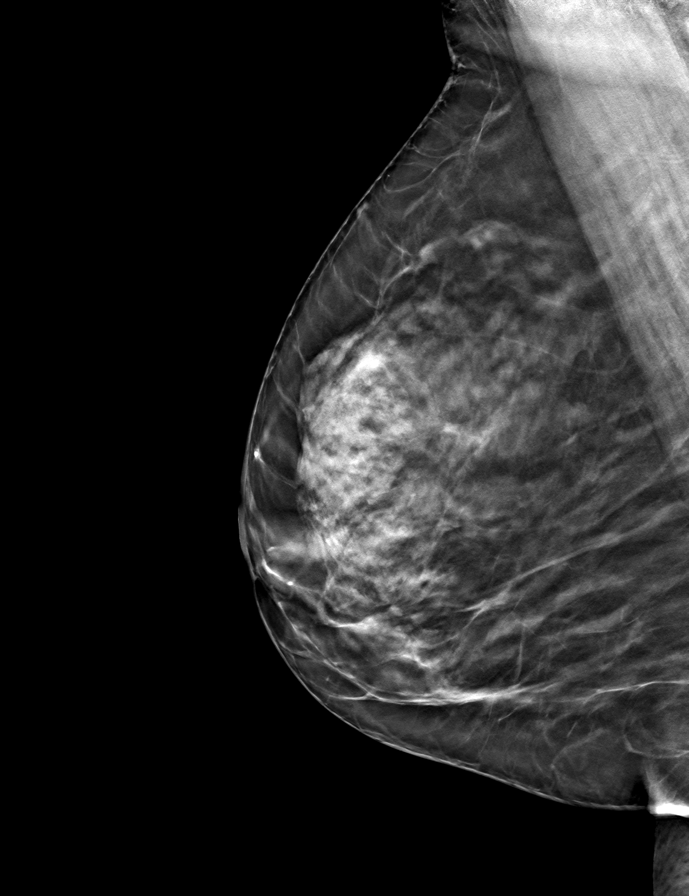

[L MLO tomo · tomo slice 31/61.0]
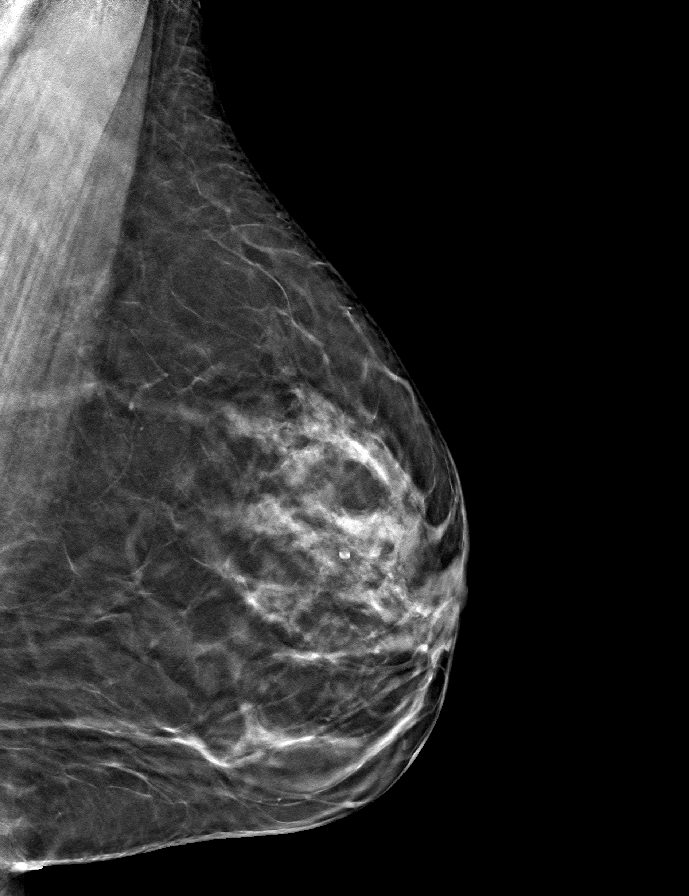

[L CC tomo · tomo slice 30/59.0]
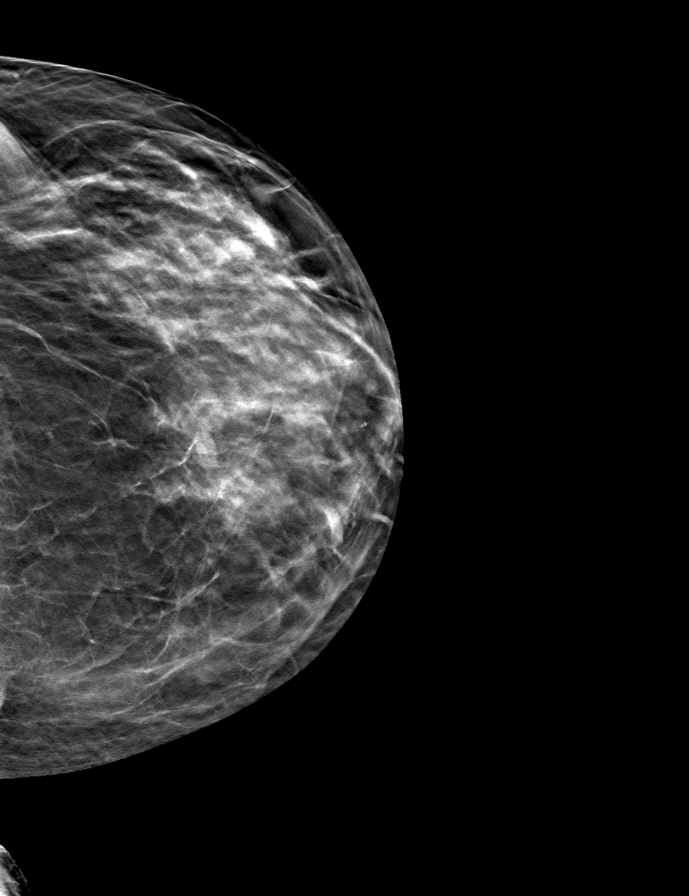

[R CC tomo · tomo slice 33/65.0]
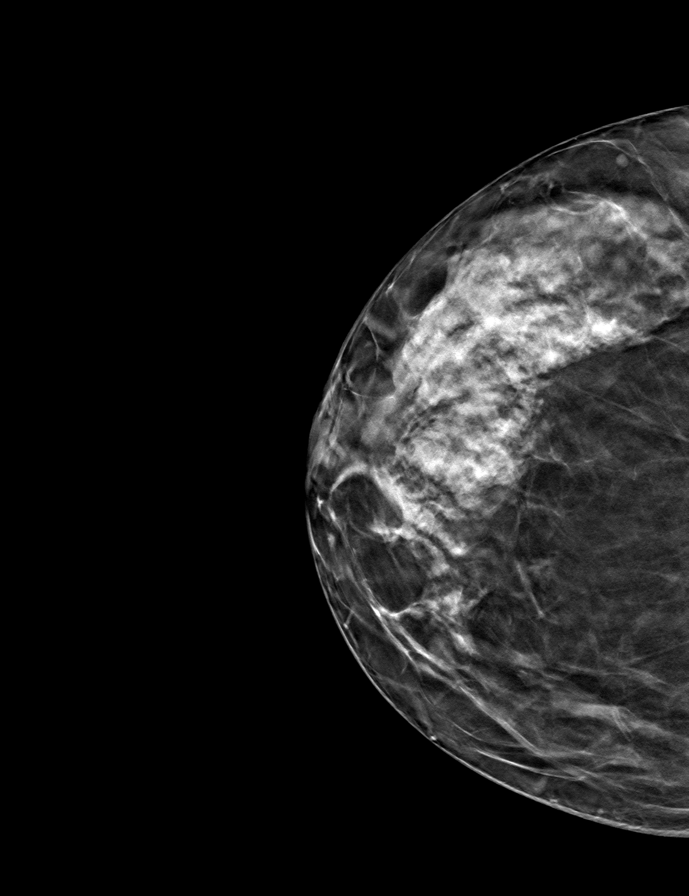

[9 of 24 positions shown; findings below may reference images not displayed]

ACR Breast Density Category c: The breast tissue is heterogeneously
dense, which may obscure small masses.
FINDINGS: There are no findings suspicious for malignancy. The images were
evaluated with computer-aided detection.
IMPRESSION: No mammographic evidence of malignancy. A result letter of this
screening mammogram will be mailed directly to the patient.

RECOMMENDATION:
Screening mammogram in one year. (Code:T4-5-GWO)

BI-RADS CATEGORY  1: Negative.

## 2022-10-20 ENCOUNTER — Other Ambulatory Visit: Payer: Self-pay | Admitting: Student

## 2022-10-20 DIAGNOSIS — Z1231 Encounter for screening mammogram for malignant neoplasm of breast: Secondary | ICD-10-CM

## 2022-11-11 ENCOUNTER — Ambulatory Visit
Admission: RE | Admit: 2022-11-11 | Discharge: 2022-11-11 | Disposition: A | Discharge status: Home / Self Care | Payer: 59 | Source: Ambulatory Visit | Attending: Student | Admitting: Student

## 2022-11-11 DIAGNOSIS — Z1231 Encounter for screening mammogram for malignant neoplasm of breast: Secondary | ICD-10-CM

## 2023-01-11 ENCOUNTER — Ambulatory Visit (AMBULATORY_SURGERY_CENTER): Payer: 59 | Admitting: *Deleted

## 2023-01-11 VITALS — Ht 68.0 in | Wt 140.0 lb

## 2023-01-11 DIAGNOSIS — Z1211 Encounter for screening for malignant neoplasm of colon: Secondary | ICD-10-CM

## 2023-01-11 MED ORDER — NA SULFATE-K SULFATE-MG SULF 17.5-3.13-1.6 GM/177ML PO SOLN: 1.0000 | Freq: Once | ORAL | 0 refills | Status: AC

## 2023-01-11 NOTE — Progress Notes (Signed)
Pt's name and DOB verified at the beginning of the pre-visit.  Pt denies any difficulty with ambulating,sitting, laying down or rolling side to side Gave both [facility] main # and MD on call # prior to instructions.  No egg or soy allergy known to patient  No issues known to pt with past sedation with any surgeries or procedures Pt denies having issues being intubated Pt has no issues moving head neck or swallowing No FH of Malignant Hyperthermia Pt is not on diet pills Pt is not on home 02  Pt is not on blood thinners  Pt denies issues with constipation  Pt is not on dialysis Pt denise any abnormal heart rhythms  Pt denies any upcoming cardiac testing Pt encouraged to use to use Singlecare or Goodrx to reduce cost  Patient's chart reviewed by Cathlyn Parsons CNRA prior to pre-visit and patient appropriate for the [facility].  Pre-visit completed and red dot placed by patient's name on their procedure day (on provider's schedule).  . Visit by phone Pt states weight is 140 lb Instructed pt why it is important to and  to call if they have any changes in health or new medications. Directed them to the # given and on instructions.   Pt states they will.  Instructions reviewed with pt and pt states understanding. Instructed to review again prior to procedure. Pt states they will.  Instructions sent by mail with coupon and by my chart Marijuana use every day Instructed not to smoke it day before or day off procedure. Pt states she will that.

## 2023-02-04 ENCOUNTER — Encounter: Payer: Self-pay | Admitting: Certified Registered Nurse Anesthetist

## 2023-02-06 ENCOUNTER — Ambulatory Visit (AMBULATORY_SURGERY_CENTER): Payer: 59 | Admitting: Gastroenterology

## 2023-02-06 ENCOUNTER — Encounter: Payer: Self-pay | Admitting: Gastroenterology

## 2023-02-06 VITALS — BP 128/95 | HR 88 | Temp 97.2°F | Resp 13 | Ht 68.0 in | Wt 140.0 lb

## 2023-02-06 DIAGNOSIS — Z1211 Encounter for screening for malignant neoplasm of colon: Secondary | ICD-10-CM | POA: Diagnosis not present

## 2023-02-06 DIAGNOSIS — D120 Benign neoplasm of cecum: Secondary | ICD-10-CM

## 2023-02-06 DIAGNOSIS — K635 Polyp of colon: Secondary | ICD-10-CM

## 2023-02-06 DIAGNOSIS — K5730 Diverticulosis of large intestine without perforation or abscess without bleeding: Secondary | ICD-10-CM

## 2023-02-06 DIAGNOSIS — D122 Benign neoplasm of ascending colon: Secondary | ICD-10-CM

## 2023-02-06 DIAGNOSIS — D128 Benign neoplasm of rectum: Secondary | ICD-10-CM

## 2023-02-06 DIAGNOSIS — K641 Second degree hemorrhoids: Secondary | ICD-10-CM

## 2023-02-06 MED ORDER — SODIUM CHLORIDE 0.9 % IV SOLN: 500.0000 mL | INTRAVENOUS | Status: DC

## 2023-02-06 NOTE — Progress Notes (Signed)
Report given to PACU, vss 

## 2023-02-06 NOTE — Progress Notes (Signed)
Called to room to assist during endoscopic procedure.  Patient ID and intended procedure confirmed with present staff. Received instructions for my participation in the procedure from the performing physician.  

## 2023-02-06 NOTE — Progress Notes (Signed)
GASTROENTEROLOGY PROCEDURE H&P NOTE   Primary Care Physician: Greta Doom, MD    Reason for Procedure:  Colon Cancer screening  Plan:    Colonoscopy  Patient is appropriate for endoscopic procedure(s) in the ambulatory ([facility]) setting.  The nature of the procedure, as well as the risks, benefits, and alternatives were carefully and thoroughly reviewed with the patient. Ample time for discussion and questions allowed. The patient understood, was satisfied, and agreed to proceed.     HPI: Terri Rasmussen is a 72 y.o. female who presents for colonoscopy for routine Colon Cancer screening.  No active GI symptoms.  No known family history of colon cancer or related malignancy.  Patient is otherwise without complaints or active issues today.  Past Medical History:  Diagnosis Date   Arthritis    Essential hypertension 03/23/2016   Last Assessment & Plan:  Last routine visit several years ago, no previous history of hypertension. Blood pressure elevated today, lower when rechecked; initial systolic blood pressure was 166. We'll recheck in 4 weeks.   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 Authorizing Provider  atorvastatin (LIPITOR) 40 MG tablet Take 40 mg by mouth daily.    [provider]  losartan (COZAAR) 50 MG tablet Take 50 mg by mouth daily.    [provider]  omeprazole (PRILOSEC) 20 MG capsule Take 1 capsule (20 mg total) by mouth daily. Patient not taking: Reported on  01/11/2023 08/17/18   Pricilla Riffle, MD    Current Outpatient Medications  Medication Sig Dispense Refill   atorvastatin (LIPITOR) 40 MG tablet Take 40 mg by mouth daily.     losartan (COZAAR) 50 MG tablet Take 50 mg by mouth daily.     omeprazole (PRILOSEC) 20 MG capsule Take 1 capsule (20 mg total) by mouth daily. (Patient not taking: Reported on 01/11/2023) 30 capsule 11   Current Facility-Administered Medications  Medication Dose Route Frequency Provider Last Rate Last Admin   0.9 %  sodium chloride infusion  500 mL Intravenous Continuous Quetzally Callas V, DO        Allergies as of 02/06/2023   (No Known Allergies)    Family History  Problem Relation Age of Onset   Breast cancer Neg Hx    Colon cancer Neg Hx    Colon polyps Neg Hx    Heart disease Neg Hx    Rectal cancer Neg Hx    Stomach cancer Neg Hx     Social History   Socioeconomic History   Marital status: Legally Separated    Spouse name: Not on file   Number of children: Not on file   Years of education: Not on file   Highest education level: Not on file  Occupational History   Not on file  Tobacco Use   Smoking status: Never   Smokeless tobacco: Never  Vaping Use   Vaping status: Never Used  Substance and Sexual Activity   Alcohol use: Yes  Comment: beer   Drug use: Yes    Types: Marijuana   Sexual activity: Not on file  Other Topics Concern   Not on file  Social History Narrative   Not on file   Social Determinants of Health   Financial Resource Strain: Not on file  Food Insecurity: Not on file  Transportation Needs: Not on file  Physical Activity: Not on file  Stress: Not on file  Social Connections: Not on file  Intimate Partner Violence: Not on file    Physical Exam: Vital signs in last 24 hours: @BP  130/84   Pulse 77   Temp (!) 97.2 F (36.2 C) (Skin)   Ht 5\' 8"  (1.727 m)   Wt 140 lb (63.5 kg)   SpO2 97%   BMI 21.29 kg/m  GEN: NAD EYE: Sclerae anicteric ENT: MMM CV:  Non-tachycardic Pulm: CTA b/l GI: Soft, NT/ND NEURO:  Alert & Oriented x 3   Doristine Locks, DO [facility]   02/06/2023 10:17 AM

## 2023-02-06 NOTE — Patient Instructions (Signed)
Resume previous diet Continue present medications Await pathology results Handouts/ information given for polyps, diverticulosis and hemorrhoids  YOU HAD AN ENDOSCOPIC PROCEDURE TODAY AT THE [facility]:   Refer to the procedure report that was given to you for any specific questions about what was found during the examination.  If the procedure report does not answer your questions, please call your gastroenterologist to clarify.  If you requested that your care partner not be given the details of your procedure findings, then the procedure report has been included in a sealed envelope for you to review at your convenience later.  YOU SHOULD EXPECT: Some feelings of bloating in the abdomen. Passage of more gas than usual.  Walking can help get rid of the air that was put into your GI tract during the procedure and reduce the bloating. If you had a lower endoscopy (such as a colonoscopy or flexible sigmoidoscopy) you may notice spotting of blood in your stool or on the toilet paper. If you underwent a bowel prep for your procedure, you may not have a normal bowel movement for a few days.  Please Note:  You might notice some irritation and congestion in your nose or some drainage.  This is from the oxygen used during your procedure.  There is no need for concern and it should clear up in a day or so.  SYMPTOMS TO REPORT IMMEDIATELY:  Following lower endoscopy (colonoscopy or flexible sigmoidoscopy):  Excessive amounts of blood in the stool  Significant tenderness or worsening of abdominal pains  Swelling of the abdomen that is new, acute  Fever of 100F or higher  For urgent or emergent issues, a gastroenterologist can be reached at any hour by calling [phone]. Do not use MyChart messaging for urgent concerns.    DIET:  We do recommend a small meal at first, but then you may proceed to your regular diet.  Drink plenty of fluids but you should avoid alcoholic beverages for 24  hours.  ACTIVITY:  You should plan to take it easy for the rest of today and you should NOT DRIVE or use heavy machinery until tomorrow (because of the sedation medicines used during the test).    FOLLOW UP: Our staff will call the number listed on your records the next business day following your procedure.  We will call around 7:15- 8:00 am to check on you and address any questions or concerns that you may have regarding the information given to you following your procedure. If we do not reach you, we will leave a message.     If any biopsies were taken you will be contacted by phone or by letter within the next 1-3 weeks.  Please call us at [phone] if you have not heard about the biopsies in 3 weeks.    SIGNATURES/CONFIDENTIALITY: You and/or your care partner have signed paperwork which will be entered into your electronic medical record.  These signatures attest to the fact that that the information above on your After Visit Summary has been reviewed and is understood.  Full responsibility of the confidentiality of this discharge information lies with you and/or your care-partner. 

## 2023-02-06 NOTE — Op Note (Signed)
[facility] Patient Name: Terri Rasmussen Procedure Date: 02/06/2023 10:32 AM MRN: [id number] Endoscopist: Doristine Locks , MD, [id number] Age: 72 Referring MD:  Date of Birth: [birth year] Gender: Female Account #: [id number] Procedure:                Colonoscopy Indications:              Screening for colorectal malignant neoplasm, This                            is the patient's first colonoscopy Medicines:                Monitored Anesthesia Care Procedure:                Pre-Anesthesia Assessment:                           - Prior to the procedure, a History and Physical                            was performed, and patient medications and                            allergies were reviewed. The patient's tolerance of                            previous anesthesia was also reviewed. The risks                            and benefits of the procedure and the sedation                            options and risks were discussed with the patient.                            All questions were answered, and informed consent                            was obtained. Prior Anticoagulants: The patient has                            taken no anticoagulant or antiplatelet agents. ASA                            Grade Assessment: III - A patient with severe                            systemic disease. After reviewing the risks and                            benefits, the patient was deemed in satisfactory                            condition to undergo the procedure.  After obtaining informed consent, the colonoscope                            was passed under direct vision. Throughout the                            procedure, the patient's blood pressure, pulse, and                            oxygen saturations were monitored continuously. The                            CF [id number] #[id number] was introduced through the anus                            and advanced  to the the terminal ileum. The                            colonoscopy was performed without difficulty. The                            patient tolerated the procedure well. The quality                            of the bowel preparation was good. The terminal                            ileum, ileocecal valve, appendiceal orifice, and                            rectum were photographed. Scope In: 10:35:12 AM Scope Out: 10:49:27 AM Scope Withdrawal Time: 0 hours 11 minutes 52 seconds  Total Procedure Duration: 0 hours 14 minutes 15 seconds  Findings:                 The perianal and digital rectal examinations were                            normal.                           Two sessile polyps were found in the ascending                            colon and cecum. The polyps were 5 to 8 mm in size.                            These polyps were removed with a cold snare.                            Resection and retrieval were complete. Estimated                            blood loss was minimal.  A few small-mouthed diverticula were found in the                            transverse colon.                           Two sessile polyps were found in the rectum. The                            polyps were 2 to 3 mm in size. These polyps were                            removed with a cold snare. Resection and retrieval                            were complete. Estimated blood loss was minimal.                           Non-bleeding internal hemorrhoids were found during                            retroflexion. The hemorrhoids were small and Grade                            II (internal hemorrhoids that prolapse but reduce                            spontaneously).                           The terminal ileum appeared normal. Complications:            No immediate complications. Estimated Blood Loss:     Estimated blood loss was minimal. Impression:               - Two 5  to 8 mm polyps in the ascending colon and                            in the cecum, removed with a cold snare. Resected                            and retrieved.                           - Diverticulosis in the transverse colon.                           - Two 2 to 3 mm polyps in the rectum, removed with                            a cold snare. Resected and retrieved.                           - Non-bleeding internal hemorrhoids.                           -  The examined portion of the ileum was normal. Recommendation:           - Patient has a contact number available for                            emergencies. The signs and symptoms of potential                            delayed complications were discussed with the                            patient. Return to normal activities tomorrow.                            Written discharge instructions were provided to the                            patient.                           - Resume previous diet.                           - Continue present medications.                           - Await pathology results.                           - Repeat colonoscopy for surveillance based on                            pathology results.                           - Return to GI office PRN. Doristine Locks, MD 02/06/2023 10:53:31 AM

## 2023-02-06 NOTE — Progress Notes (Signed)
VS by DT  Pt's states no medical or surgical changes since previsit or office visit.  

## 2023-02-07 ENCOUNTER — Telehealth: Payer: Self-pay

## 2023-02-07 NOTE — Telephone Encounter (Signed)
  Follow up Call-     02/06/2023   10:15 AM  Call back number  Post procedure Call Back phone  # [phone]  Permission to leave phone message Yes     Patient questions:  Do you have a fever, pain , or abdominal swelling? No. Pain Score  0 *  Have you tolerated food without any problems? Yes.    Have you been able to return to your normal activities? Yes.    Do you have any questions about your discharge instructions: Diet   No. Medications  No. Follow up visit  No.  Do you have questions or concerns about your Care? No.  Actions: * If pain score is 4 or above: No action needed, pain <4.

## 2023-02-08 LAB — SURGICAL PATHOLOGY

## 2023-02-10 ENCOUNTER — Encounter: Payer: Self-pay | Admitting: Gastroenterology

## 2024-01-01 ENCOUNTER — Other Ambulatory Visit: Payer: Self-pay | Admitting: Student

## 2024-01-01 DIAGNOSIS — Z1231 Encounter for screening mammogram for malignant neoplasm of breast: Secondary | ICD-10-CM

## 2024-01-23 ENCOUNTER — Ambulatory Visit
Admission: RE | Admit: 2024-01-23 | Discharge: 2024-01-23 | Disposition: A | Source: Ambulatory Visit | Attending: Student

## 2024-01-23 DIAGNOSIS — Z1231 Encounter for screening mammogram for malignant neoplasm of breast: Secondary | ICD-10-CM
